# Patient Record
Sex: Female | Born: 1945
Health system: Southern US, Community
[De-identification: ages and names within clinical notes are randomized; demographics above are authoritative.]

## PROBLEM LIST (undated history)

## (undated) DIAGNOSIS — K469 Unspecified abdominal hernia without obstruction or gangrene: Secondary | ICD-10-CM

## (undated) DIAGNOSIS — K222 Esophageal obstruction: Secondary | ICD-10-CM

## (undated) DIAGNOSIS — M3500 Sicca syndrome, unspecified: Secondary | ICD-10-CM

## (undated) DIAGNOSIS — M810 Age-related osteoporosis without current pathological fracture: Secondary | ICD-10-CM

## (undated) HISTORY — PX: INTRAUTERINE DEVICE INSERTION: SHX323

## (undated) HISTORY — DX: Sjogren syndrome, unspecified: M35.00

## (undated) HISTORY — PX: HERNIA REPAIR: SHX51

## (undated) HISTORY — DX: Unspecified abdominal hernia without obstruction or gangrene: K46.9

## (undated) HISTORY — DX: Age-related osteoporosis without current pathological fracture: M81.0

## (undated) HISTORY — DX: Esophageal obstruction: K22.2

---

## 1999-06-01 ENCOUNTER — Other Ambulatory Visit: Admission: RE | Admit: 1999-06-01 | Discharge: 1999-06-01 | Payer: Self-pay | Admitting: Gynecology

## 2000-07-22 ENCOUNTER — Other Ambulatory Visit: Admission: RE | Admit: 2000-07-22 | Discharge: 2000-07-22 | Payer: Self-pay | Admitting: Gynecology

## 2000-07-26 ENCOUNTER — Encounter: Payer: Self-pay | Admitting: Gynecology

## 2000-07-26 ENCOUNTER — Encounter: Admission: RE | Admit: 2000-07-26 | Discharge: 2000-07-26 | Payer: Self-pay | Admitting: Gynecology

## 2001-12-30 ENCOUNTER — Other Ambulatory Visit: Admission: RE | Admit: 2001-12-30 | Discharge: 2001-12-30 | Payer: Self-pay | Admitting: Gynecology

## 2003-01-11 ENCOUNTER — Other Ambulatory Visit: Admission: RE | Admit: 2003-01-11 | Discharge: 2003-01-11 | Payer: Self-pay | Admitting: Gynecology

## 2004-03-06 ENCOUNTER — Other Ambulatory Visit: Admission: RE | Admit: 2004-03-06 | Discharge: 2004-03-06 | Payer: Self-pay | Admitting: Gynecology

## 2004-11-10 ENCOUNTER — Ambulatory Visit (HOSPITAL_COMMUNITY): Admission: RE | Admit: 2004-11-10 | Discharge: 2004-11-10 | Payer: Self-pay | Admitting: Gynecology

## 2005-10-22 ENCOUNTER — Encounter: Payer: Self-pay | Admitting: Family Medicine

## 2005-10-22 LAB — CONVERTED CEMR LAB

## 2006-07-11 ENCOUNTER — Emergency Department (HOSPITAL_COMMUNITY): Admission: EM | Admit: 2006-07-11 | Discharge: 2006-07-11 | Payer: Self-pay | Admitting: Emergency Medicine

## 2006-07-11 ENCOUNTER — Encounter: Admission: RE | Admit: 2006-07-11 | Discharge: 2006-07-11 | Payer: Self-pay

## 2006-08-08 ENCOUNTER — Encounter: Admission: RE | Admit: 2006-08-08 | Discharge: 2006-08-08 | Payer: Self-pay | Admitting: Gastroenterology

## 2006-08-15 ENCOUNTER — Other Ambulatory Visit: Admission: RE | Admit: 2006-08-15 | Discharge: 2006-08-15 | Payer: Self-pay | Admitting: Gynecology

## 2006-08-23 ENCOUNTER — Ambulatory Visit (HOSPITAL_COMMUNITY): Admission: RE | Admit: 2006-08-23 | Discharge: 2006-08-23 | Payer: Self-pay | Admitting: Gastroenterology

## 2006-09-26 ENCOUNTER — Ambulatory Visit: Payer: Self-pay | Admitting: Family Medicine

## 2007-03-14 ENCOUNTER — Ambulatory Visit: Payer: Self-pay | Admitting: Family Medicine

## 2007-05-29 ENCOUNTER — Encounter: Payer: Self-pay | Admitting: Family Medicine

## 2007-06-12 ENCOUNTER — Ambulatory Visit: Payer: Self-pay | Admitting: Family Medicine

## 2007-06-12 LAB — CONVERTED CEMR LAB
ALT: 15 units/L (ref 0–35)
AST: 20 units/L (ref 0–37)
Alkaline Phosphatase: 83 units/L (ref 39–117)
BUN: 9 mg/dL (ref 6–23)
Basophils Relative: 0.1 % (ref 0.0–1.0)
Bilirubin Urine: NEGATIVE
Bilirubin, Direct: 0.1 mg/dL (ref 0.0–0.3)
Blood in Urine, dipstick: NEGATIVE
CO2: 30 meq/L (ref 19–32)
Calcium: 9.5 mg/dL (ref 8.4–10.5)
Chloride: 105 meq/L (ref 96–112)
Eosinophils Relative: 2.2 % (ref 0.0–5.0)
Glucose, Bld: 98 mg/dL (ref 70–99)
Glucose, Urine, Semiquant: NEGATIVE
Ketones, urine, test strip: NEGATIVE
LDL Cholesterol: 97 mg/dL (ref 0–99)
Lymphocytes Relative: 26.5 % (ref 12.0–46.0)
Monocytes Relative: 9.7 % (ref 3.0–11.0)
Nitrite: NEGATIVE
Platelets: 309 10*3/uL (ref 150–400)
Protein, U semiquant: NEGATIVE
RBC: 4.34 M/uL (ref 3.87–5.11)
RDW: 11.8 % (ref 11.5–14.6)
Specific Gravity, Urine: 1.02
Total CHOL/HDL Ratio: 3.8
Total Protein: 8.1 g/dL (ref 6.0–8.3)
Triglycerides: 114 mg/dL (ref 0–149)
Urobilinogen, UA: 0.2
VLDL: 23 mg/dL (ref 0–40)
WBC: 3.7 10*3/uL — ABNORMAL LOW (ref 4.5–10.5)
pH: 6.5

## 2007-08-22 ENCOUNTER — Ambulatory Visit: Payer: Self-pay | Admitting: Family Medicine

## 2007-08-22 DIAGNOSIS — M3500 Sicca syndrome, unspecified: Secondary | ICD-10-CM | POA: Insufficient documentation

## 2007-08-22 DIAGNOSIS — K222 Esophageal obstruction: Secondary | ICD-10-CM | POA: Insufficient documentation

## 2008-01-06 ENCOUNTER — Telehealth: Payer: Self-pay | Admitting: Family Medicine

## 2008-01-13 ENCOUNTER — Ambulatory Visit: Payer: Self-pay | Admitting: Family Medicine

## 2008-03-16 ENCOUNTER — Telehealth: Payer: Self-pay | Admitting: Family Medicine

## 2008-10-18 ENCOUNTER — Ambulatory Visit: Payer: Self-pay | Admitting: Family Medicine

## 2008-10-18 DIAGNOSIS — J0110 Acute frontal sinusitis, unspecified: Secondary | ICD-10-CM | POA: Insufficient documentation

## 2008-10-18 DIAGNOSIS — J069 Acute upper respiratory infection, unspecified: Secondary | ICD-10-CM | POA: Insufficient documentation

## 2008-11-01 ENCOUNTER — Ambulatory Visit: Payer: Self-pay | Admitting: Internal Medicine

## 2009-06-17 ENCOUNTER — Ambulatory Visit (HOSPITAL_COMMUNITY): Admission: RE | Admit: 2009-06-17 | Discharge: 2009-06-17 | Payer: Self-pay | Admitting: Family Medicine

## 2009-06-17 LAB — HM MAMMOGRAPHY

## 2009-12-29 ENCOUNTER — Ambulatory Visit: Payer: Self-pay | Admitting: Family Medicine

## 2009-12-29 LAB — CONVERTED CEMR LAB
ALT: 17 units/L (ref 0–35)
AST: 22 units/L (ref 0–37)
Alkaline Phosphatase: 81 units/L (ref 39–117)
BUN: 15 mg/dL (ref 6–23)
Basophils Relative: 1.1 % (ref 0.0–3.0)
Bilirubin Urine: NEGATIVE
Bilirubin, Direct: 0.1 mg/dL (ref 0.0–0.3)
Chloride: 109 meq/L (ref 96–112)
Cholesterol: 160 mg/dL (ref 0–200)
Creatinine, Ser: 0.8 mg/dL (ref 0.4–1.2)
Eosinophils Relative: 1.7 % (ref 0.0–5.0)
GFR calc non Af Amer: 76.86 mL/min (ref >60)
Ketones, urine, test strip: NEGATIVE
LDL Cholesterol: 90 mg/dL (ref 0–99)
Lymphocytes Relative: 34.9 % (ref 12.0–46.0)
Monocytes Relative: 8.9 % (ref 3.0–12.0)
Neutrophils Relative %: 53.4 % (ref 43.0–77.0)
Nitrite: NEGATIVE
Platelets: 251 10*3/uL (ref 150.0–400.0)
RBC: 4.44 M/uL (ref 3.87–5.11)
Total Bilirubin: 0.7 mg/dL (ref 0.3–1.2)
Total CHOL/HDL Ratio: 3
Total Protein: 8.4 g/dL — ABNORMAL HIGH (ref 6.0–8.3)
Triglycerides: 97 mg/dL (ref 0.0–149.0)
Urobilinogen, UA: 0.2
VLDL: 19.4 mg/dL (ref 0.0–40.0)
WBC: 2.6 10*3/uL — ABNORMAL LOW (ref 4.5–10.5)

## 2010-01-02 ENCOUNTER — Ambulatory Visit: Payer: Self-pay | Admitting: Family Medicine

## 2010-01-02 DIAGNOSIS — D72819 Decreased white blood cell count, unspecified: Secondary | ICD-10-CM | POA: Insufficient documentation

## 2010-01-06 ENCOUNTER — Encounter (INDEPENDENT_AMBULATORY_CARE_PROVIDER_SITE_OTHER): Payer: Self-pay | Admitting: *Deleted

## 2010-02-20 ENCOUNTER — Encounter (INDEPENDENT_AMBULATORY_CARE_PROVIDER_SITE_OTHER): Payer: Self-pay | Admitting: *Deleted

## 2010-02-20 ENCOUNTER — Ambulatory Visit: Payer: Self-pay | Admitting: Family Medicine

## 2010-02-20 LAB — CONVERTED CEMR LAB
Eosinophils Absolute: 0 10*3/uL (ref 0.0–0.7)
Eosinophils Relative: 1 % (ref 0.0–5.0)
HCT: 41.9 % (ref 36.0–46.0)
Lymphs Abs: 1.4 10*3/uL (ref 0.7–4.0)
MCHC: 34.4 g/dL (ref 30.0–36.0)
MCV: 94 fL (ref 78.0–100.0)
Monocytes Absolute: 0.3 10*3/uL (ref 0.1–1.0)
Platelets: 280 10*3/uL (ref 150.0–400.0)
WBC: 3.8 10*3/uL — ABNORMAL LOW (ref 4.5–10.5)

## 2010-02-22 ENCOUNTER — Ambulatory Visit: Payer: Self-pay | Admitting: Internal Medicine

## 2010-03-16 ENCOUNTER — Telehealth: Payer: Self-pay | Admitting: Family Medicine

## 2010-03-22 ENCOUNTER — Telehealth (INDEPENDENT_AMBULATORY_CARE_PROVIDER_SITE_OTHER): Payer: Self-pay | Admitting: *Deleted

## 2010-03-23 ENCOUNTER — Ambulatory Visit: Payer: Self-pay | Admitting: Internal Medicine

## 2010-09-25 ENCOUNTER — Telehealth: Payer: Self-pay | Admitting: Family Medicine

## 2010-11-13 ENCOUNTER — Ambulatory Visit
Admission: RE | Admit: 2010-11-13 | Discharge: 2010-11-13 | Payer: Self-pay | Source: Home / Self Care | Attending: Family Medicine | Admitting: Family Medicine

## 2010-11-20 ENCOUNTER — Telehealth: Payer: Self-pay | Admitting: Family Medicine

## 2010-11-21 NOTE — Progress Notes (Signed)
Summary: pink eye  Phone Note Call from Patient   Caller: Patient Call For: Roderick Pee MD Reason for Call: Acute Illness Summary of Call: Has pink eye and is requesting treatment with no visit if possible. [facility] ) [phone] Initial call taken by: [facility] CMA AAMA,  September 25, 2010 9:23 AM  Follow-up for Phone Call        gentamicin ophthalmic drops, dispensed 10 cc directions one drop b.i.d., refills x 1 Follow-up by: Roderick Pee MD,  September 25, 2010 10:03 AM    New/Updated Medications: GENTAMICIN SULFATE 10 MG/ML SOLN (GENTAMICIN SULFATE) one drop in affected eye two times a day Prescriptions: GENTAMICIN SULFATE 10 MG/ML SOLN (GENTAMICIN SULFATE) one drop in affected eye two times a day  #10 cc x 0   Entered by:   Lynann Beaver CMA AAMA   Authorized by:   Roderick Pee MD   Signed by:   Lynann Beaver CMA AAMA on 09/25/2010   Method used:   Electron[facility]       [address]       [place], [place]  [address]       Ph: [phone] or [phone]       Fax: [phone]   RxID:   [id number] GENTAMICIN SULFATE 10 MG/ML SOLN (GENTAMICIN SULFATE) one drop in affected eye two times a day  #10 cc x 0   Entered by:   Lynann Beaver CMA AAMA   Authorized by:   Roderick Pee MD   Signed by:   Lynann Beaver CMA AAMA on 09/25/2010   Method used:   Electronically to        [facility] ([facility]ail[facility]       [address]       [place], [place]  [address]       Ph: [phone] or [phone]       Fax: [phone]   RxID:   [id number]  Notified pt.

## 2010-11-21 NOTE — Letter (Signed)
Summary: Previsit letter  [facility]  [address] [place], [place] [address]   Phone: [phone]  Fax: [phone]       01/06/2010 MRN: [id number]  Erika Jensen [address] [place], [place]  [address]  Dear [name],  Welcome to the Gastroenterology Division at [facility].    You are scheduled to see a nurse for your pre-procedure visit on 02/22/2010 at 9:00AM on the 3rd floor at [facility], [address]  We ask that you try to arrive at our office 15 minutes prior to your appointment time to allow for check-in.  Your nurse visit will consist of discussing your medical and surgical history, your immediate family medical history, and your medications.    Please bring a complete list of all your medications or, if you prefer, bring the medication bottles and we will list them.  We will need to be aware of both prescribed and over the counter drugs.  We will need to know exact dosage information as well.  If you are on blood thinners (Coumadin, Plavix, Aggrenox, Ticlid, etc.) please call our office today/prior to your appointment, as we need to consult with your physician about holding your medication.   Please be prepared to read and sign documents such as consent forms, a financial agreement, and acknowledgement forms.  If necessary, and with your consent, a friend or relative is welcome to sit-in on the nurse visit with you.  Please bring your insurance card so that we may make a copy of it.  If your insurance requires a referral to see a specialist, please bring your referral form from your primary care physician.  No co-pay is required for this nurse visit.     If you cannot keep your appointment, please call [phone] to cancel or reschedule prior to your appointment date.  This allows [place] the opportunity to schedule an appointment for another patient in need of care.    Thank you for choosing [facility] for your medical needs.  We  appreciate the opportunity to care for you.  Please visit [place] at our website  to learn more about our practice.                     Sincerely.                                                                                                                   The Gastroenterology Division

## 2010-11-21 NOTE — Miscellaneous (Signed)
Summary: [facility]  Clinical Lists Changes  Medications: Added new medication of MOVIPREP 100 GM  SOLR (PEG-KCL-NACL-NASULF-NA ASC-C) As per prep instructions. - Signed Rx of MOVIPREP 100 GM  SOLR (PEG-KCL-NACL-NASULF-NA ASC-C) As per prep instructions.;  #1 x 0;  Signed;  Entered by: Ezra Sites RN;  Authorized by: Iva Boop MD, [facility];  Method used: Electronically to [id number]*, [address] [place], [place], [place]  [address], Ph: [phone], Fax: [phone] Observations: Added new observation of NKA: T (02/22/2010 8:56)    Prescriptions: MOVIPREP 100 GM  SOLR (PEG-KCL-NACL-NASULF-NA ASC-C) As per prep instructions.  #1 x 0   Entered by:   Ezra Sites RN   Authorized by:   Iva Boop MD, [facility]   Signed by:   Ezra Sites RN on 02/22/2010   Method used:   Electronically to        [facility] [phone]* (retail)       [address]       [place], [place]  [address]       Ph: [phone]       Fax: [phone]   RxID:   [id number]

## 2010-11-21 NOTE — Letter (Signed)
Summary: [facility] Instructions  [facility]  [address] [place], [place] [address]   Phone: [phone]  Fax: [phone]       Laura-Lee Demers    1946-01-21    MRN: [id number]        Procedure Day /Date:  Thursday 03/16/2010     Arrival Time: 12:30 pm      Procedure Time: 1:30 pm     Location of Procedure:                    _x _   Endoscopy Center (4th Floor)                        PREPARATION FOR COLONOSCOPY WITH MOVIPREP   Starting 5 days prior to your procedure Saturday 5/21 do not eat nuts, seeds, popcorn, corn, beans, peas,  salads, or any raw vegetables.  Do not take any fiber supplements (e.g. Metamucil, Citrucel, and Benefiber).  THE DAY BEFORE YOUR PROCEDURE         DATE: Wednesday 5/25  1.  Drink clear liquids the entire day-NO SOLID FOOD  2.  Do not drink anything colored red or purple.  Avoid juices with pulp.  No orange juice.  3.  Drink at least 64 oz. (8 glasses) of fluid/clear liquids during the day to prevent dehydration and help the prep work efficiently.  CLEAR LIQUIDS INCLUDE: Water Jello Ice Popsicles Tea (sugar ok, no milk/cream) Powdered fruit flavored drinks Coffee (sugar ok, no milk/cream) Gatorade Juice: apple, white grape, white cranberry  Lemonade Clear bullion, consomm, broth Carbonated beverages (any kind) Strained chicken noodle soup Hard Candy                             4.  In the morning, mix first dose of MoviPrep solution:    Empty 1 Pouch A and 1 Pouch B into the disposable container    Add lukewarm drinking water to the top line of the container. Mix to dissolve    Refrigerate (mixed solution should be used within 24 hrs)  5.  Begin drinking the prep at 5:00 p.m. The MoviPrep container is divided by 4 marks.   Every 15 minutes drink the solution down to the next mark (approximately 8 oz) until the full liter is complete.   6.  Follow completed prep with 16 oz of clear liquid of your choice  (Nothing red or purple).  Continue to drink clear liquids until bedtime.  7.  Before going to bed, mix second dose of MoviPrep solution:    Empty 1 Pouch A and 1 Pouch B into the disposable container    Add lukewarm drinking water to the top line of the container. Mix to dissolve    Refrigerate  THE DAY OF YOUR PROCEDURE      DATE: Thursday 5/26  Beginning at 8:30 a.m. (5 hours before procedure):         1. Every 15 minutes, drink the solution down to the next mark (approx 8 oz) until the full liter is complete.  2. Follow completed prep with 16 oz. of clear liquid of your choice.    3. You may drink clear liquids until 11:30 am (2 HOURS BEFORE PROCEDURE).   MEDICATION INSTRUCTIONS  Unless otherwise instructed, you should take regular prescription medications with a small sip of water   as early as possible the morning of  your procedure.         OTHER INSTRUCTIONS  You will need a responsible adult at least 65 years of age to accompany you and drive you home.   This person must remain in the waiting room during your procedure.  Wear loose fitting clothing that is easily removed.  Leave jewelry and other valuables at home.  However, you may wish to bring a book to read or  an iPod/MP3 player to listen to music as you wait for your procedure to start.  Remove all body piercing jewelry and leave at home.  Total time from sign-in until discharge is approximately 2-3 hours.  You should go home directly after your procedure and rest.  You can resume normal activities the  day after your procedure.  The day of your procedure you should not:   Drive   Make legal decisions   Operate machinery   Drink alcohol   Return to work  You will receive specific instructions about eating, activities and medications before you leave.    The above instructions have been reviewed and explained to me by   Ezra Sites RN  Feb 22, 2010 9:24 AM     I fully understand and can  verbalize these instructions _____________________________ Date _________

## 2010-11-21 NOTE — Procedures (Signed)
Summary: Colonoscopy  Patient: Erika Jensen Note: All result statuses are Final unless otherwise noted.  Tests: (1) Colonoscopy (COL)   COL Colonoscopy           DONE     [facility]     [address]     [place], [place]  [address]           COLONOSCOPY PROCEDURE REPORT           PATIENT:  [name], [name]  MR#:  [id number]     BIRTHDATE:  [birth year], 63 yrs. old  GENDER:  female     ENDOSCOPIST:  Iva Boop, MD, [facility]     REF. BY:  Tinnie Gens A. Tawanna Cooler, M.D.     PROCEDURE DATE:  03/23/2010     PROCEDURE:  Colonoscopy [address]     ASA CLASS:  Class I     INDICATIONS:  Routine Risk Screening     MEDICATIONS:   Fentanyl 50 mcg IV, Versed 6 mg IV           DESCRIPTION OF PROCEDURE:   After the risks benefits and     alternatives of the procedure were thoroughly explained, informed     consent was obtained.  Digital rectal exam was performed and     revealed no abnormalities.   The LB [id number] endoscope     was introduced through the anus and advanced to the cecum, which     was identified by both the appendix and ileocecal valve, without     limitations.  The quality of the prep was excellent, using     MoviPrep.  The instrument was then slowly withdrawn as the colon     was fully examined. Insertion: 2:49 minutes  Withdrawal: 7:50     minutes     <<PROCEDUREIMAGES>>           FINDINGS:  Severe diverticulosis was found in the sigmoid colon.     This was otherwise a normal examination of the colon. including     right colon retroflexion.   Retroflexed views in the rectum     revealed internal hemorrhoids.    The scope was then withdrawn     from the patient and the procedure completed.           COMPLICATIONS:  None           ENDOSCOPIC IMPRESSION:     1) Severe diverticulosis in the sigmoid colon     2) Internal hemorrhoids     3) Otherwise normal examination, excellent prep           REPEAT EXAM:  In 10 year(s) for routine screening colonocsopy.          Iva Boop, MD, [facility]           CC:  Roderick Pee, MD     The Patient           n.     [name] Doctor:   Iva Boop at 03/23/2010 10:27 AM           Veda Canning, [phone]  Note: An exclamation mark (!) indicates a result that was not dispersed into the flowsheet. Document Creation Date: 03/23/2010 10:28 AM _______________________________________________________________________  (1) Order result status: Final Collection or observation date-time: 03/23/2010 10:17 Requested date-time:  Receipt date-time:  Reported date-time:  Referring Physician:   Ordering Physician: Stan Head [phone]) Specimen Source:  Source: Launa Grill Order Number: [id number] Lab site:  Appended Document: Colonoscopy    Clinical Lists Changes  Observations: Added new observation of COLONNXTDUE: 03/2020 (03/23/2010 12:51)

## 2010-11-21 NOTE — Assessment & Plan Note (Signed)
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  She is on Aciphex 20 mg daily because of reflux and an upper esophageal stricture.  She is able to swallow with no pain or difficulty.  She gets routine eye care.  Dental care, does not do BSE monthly, but does get annual mammography.  She canceled her colonoscopy..........Marland Kitchen encouraged her to have a colonoscopy.  Done by GYN, tetanus, 2008.  Allergies: No Known Drug Allergies  Past History:  Past medical, surgical, family and social histories (including risk factors) reviewed, and no changes noted (except as noted below).  Past Medical History: Reviewed history from 08/22/2007 and no changes required. Sojrens syndrome esophageal stricture, dilatationOctober 07 bilateral hernia repair childbirth x 2  Past Surgical History: Reviewed history from 05/29/2007 and no changes required. CB X2 bicat hernias IUD perforation  Family History: Reviewed history from 08/22/2007 and no changes required. Family History of Alcoholism/Addiction Family History of Anxiety Family History Hypertension her father had a history of seizure disorder, and renal cell carcinoma.  Her mother died at 24 of dementia with underlying hypertension.  Social History: Reviewed history from 08/22/2007 and no changes required. Occupation: IT trainer Married Never Smoked Alcohol use-no Drug use-no Regular exercise-yes one daughter,  who lives in [place] with two children, a 33-year-old and a 66-year-old granddaughter, younger daughter lives here in [place], who works with her husband Raiford Noble in the furniture business  Review of Systems      See HPI  Physical Exam  General:  Well-developed,well-nourished,in no acute distress; alert,appropriate and cooperative throughout examination Head:  Normocephalic and atraumatic without obvious abnormalities. No apparent alopecia or balding. Eyes:  No corneal or conjunctival inflammation noted. EOMI. Perrla. Funduscopic exam benign, without hemorrhages, exudates or papilledema. Vision grossly normal. Ears:  External ear exam shows no significant lesions or deformities.  Otoscopic examination reveals clear canals, tympanic membranes are intact bilaterally without bulging, retraction, inflammation or discharge. Hearing is grossly normal bilaterally. Nose:  External nasal examination shows no deformity or inflammation. Nasal mucosa are pink and moist without lesions or exudates. Mouth:  Oral mucosa and oropharynx without lesions or exudates.  Teeth in good repair. Neck:  No deformities, masses, or tenderness noted. Chest Wall:  No deformities, masses, or tenderness noted. Breasts:  No mass, nodules, thickening, tenderness, bulging, retraction, inflamation, nipple discharge or skin changes noted.   Lungs:  Normal respiratory effort, chest expands symmetrically. Lungs are clear to auscultation, no crackles or wheezes. Heart:  Normal rate and regular rhythm. S1 and S2 normal without gallop, murmur, click, rub or other extra sounds. Abdomen:  Bowel sounds positive,abdomen soft and non-tender without masses, organomegaly or hernias noted. Msk:  No deformity or scoliosis noted of thoracic or lumbar spine.   Pulses:  R and L carotid,radial,femoral,dorsalis pedis and posterior tibial pulses are full and equal bilaterally Extremities:  No clubbing, cyanosis, edema, or deformity noted with normal full  range of motion of all joints.   Neurologic:  No cranial nerve deficits noted. Station and gait are normal. Plantar reflexes are down-going bilaterally. DTRs are symmetrical throughout. Sensory, motor and coordinative functions appear intact. Skin:  Intact without suspicious lesions or rashes Cervical Nodes:  No lymphadenopathy noted Axillary Nodes:  No palpable lymphadenopathy Inguinal Nodes:  No significant adenopathy Psych:  Cognition and judgment appear intact. Alert and cooperative with normal attention span and concentration. No apparent delusions, illusions, hallucinations   Impression & Recommendations:  Problem # 1:  SJOGREN'S SYNDROME (ICD-710.2) Assessment Unchanged  Orders: Prescription Created Electronically [phone])  Problem # 2:  ESOPHAGEAL STRICTURE (ICD-530.3) Assessment: Improved  Orders: Prescription Created Electronically [phone])  Problem # 3:  HEALTH SCREENING (ICD-V70.0) Assessment: Unchanged  Orders: Prescription Created Electronically [phone]) EKG w/ Interpretation ([address])  Complete Medication List: 1)  Aciphex 20 Mg Tbec (Rabeprazole sodium) .... Take 1 tablet by mouth once a day  Other Orders: Gastroenterology Referral (GI)  Patient Instructions: 1)  Please schedule a follow-up appointment in 1 year. 2)  It is important that you exercise regularly at least 20 minutes 5 times a week. If you develop chest pain, have severe difficulty breathing, or feel very tired , stop exercising immediately and seek medical attention. 3)  Schedule your mammogram/BSE monthly !!!!!!!!!!!!. 4)  Schedule a colonoscopy/sigmoidoscopy to help detect colon cancer. 5)  Take calcium +Vitamin D daily. 6)  Take an Aspirin every day. 7)  repeat your CBC in May     288.50 Prescriptions: ACIPHEX 20 MG  TBEC (RABEPRAZOLE SODIUM) Take 1 tablet by mouth once a day  #100 x 4   Entered and Authorized by:   Roderick Pee MD   Signed by:   Roderick Pee MD on 01/02/2010   Method  used:   Electronically to        [facility] [phone]* (retail)       [address]       [place], [place]  [address]       Ph: [phone]       Fax: [phone]   RxID:   [id number]

## 2010-11-21 NOTE — Progress Notes (Signed)
Summary: ? re prep instructions  Phone Note Call from Patient Call back at [phone]   Caller: Patient Call For: Leone Payor Reason for Call: Talk to Nurse Summary of Call: Patient has questions regarding prep instctions Initial call taken by: Tawni Levy,  March 22, 2010 9:16 AM  Follow-up for Phone Call        Returned pt's phone call- no answer.  Will try later. Follow-up by: Ezra Sites RN,  March 22, 2010 9:37 AM  Additional Follow-up for Phone Call Additional follow up Details #1::        Answered pt's questions concerning when to beginn prep tomorrow. Additional Follow-up by: Ezra Sites RN,  March 22, 2010 10:09 AM

## 2010-11-21 NOTE — Progress Notes (Signed)
Summary: sinus infection  Phone Note Call from Patient   Caller: Patient Call For: Roderick Pee MD Summary of Call: Pt is complaining of sinus infection....Marland KitchenURI x one week with headache and post nasal draining.  No fever.  Insists we ask Dr. Tawanna Cooler to send an antibiotic to [facility] [phone]) [phone] Initial call taken by: Lynann Beaver CMA,  Mar 16, 2010 9:29 AM  Follow-up for Phone Call        amoxicillin 500 mg, number 30 directions two in the morning, one at bedtime x bottle empty  for sinusitis, refills x 1 Follow-up by: Roderick Pee MD,  Mar 16, 2010 10:38 AM    New/Updated Medications: AMOXICILLIN 500 MG CAPS (AMOXICILLIN) take two tabs in the morning and one tab at bedtime by mouth until the bottle is empty Prescriptions: AMOXICILLIN 500 MG CAPS (AMOXICILLIN) take two tabs in the morning and one tab at bedtime by mouth until the bottle is empty  #30 x 1   Entered by:   Kern Reap CMA (AAMA)   Authorized by:   Roderick Pee MD   Signed by:   Kern Reap CMA (AAMA) on 03/16/2010   Method used:   Electronically to        [facility] [phone]* (retail)       [address]       [place], [place]  [address]       Ph: [phone]       Fax: [phone]   RxID:   [id number]

## 2010-11-23 NOTE — Assessment & Plan Note (Signed)
Summary: ? eye inf//ccm   Vital Signs:  Patient profile:   65 year old female Height:      65 inches Weight:      138 pounds BMI:     23.05 Temp:     97.6 degrees F oral BP sitting:   110 / 80  (left arm) Cuff size:   regular  Vitals Entered By: Kern Reap CMA Duncan Dull) (November 13, 2010 4:26 PM) CC: follow-up visit for eye infection   CC:  follow-up visit for eye infection.  History of Present Illness: Erika Jensen is a 65 year old, married female, nonsmoker, who comes in today for evaluation of conjunctivitis.  Early in the month.  She had an episode of conjunctivitis and use the gentamicin eyedrops and after 3 days.  The redness [address].  She was symptom free for two weeks.  A week ago the redness recurred.  Since that, time.  She's using the drops, but the redness will go away.  She is continued did wear contacts.  Daily.  No pain no blurred vision  Allergies: No Known Drug Allergies  Past History:  Past medical, surgical, family and social histories (including risk factors) reviewed for relevance to current acute and chronic problems.  Past Medical History: Reviewed history from 08/22/2007 and no changes required. Sojrens syndrome esophageal stricture, dilatationOctober 07 bilateral hernia repair childbirth x 2  Past Surgical History: Reviewed history from 05/29/2007 and no changes required. CB X2 bicat hernias IUD perforation  Family History: Reviewed history from 08/22/2007 and no changes required. Family History of Alcoholism/Addiction Family History of Anxiety Family History Hypertension her father had a history of seizure disorder, and renal cell carcinoma.  Her mother died at 64 of dementia with underlying hypertension.  Social History: Reviewed history from 08/22/2007 and no changes required. Occupation: IT trainer Married Never Smoked Alcohol use-no Drug use-no Regular exercise-yes one daughter, who lives in [place] with two children, a 73-year-old and a  31-year-old granddaughter, younger daughter lives here in [place], who works with her husband Raiford Noble in the furniture business  Review of Systems      See HPI  Physical Exam  General:  Well-developed,well-nourished,in no acute distress; alert,appropriate and cooperative throughout examination Head:  Normocephalic and atraumatic without obvious abnormalities. No apparent alopecia or balding. Eyes:  the eyes appear normal except for some erythema of the conjunctiva which does not extend over the cornea.  The pupils are equal and reactive to light and accommodation.  Anterior chamber normal   Problems:  Medical Problems Added: 1)  Dx of Conjunctivitis  (ICD-372.30)  Impression & Recommendations:  Problem # 1:  CONJUNCTIVITIS (ICD-372.30) Assessment New  Her updated medication list for this problem includes:    Ciprofloxacin Hcl 0.3 % Soln (Ciprofloxacin hcl) .Marland Kitchen... 1 gtt r & l eye three times a day until clear  Complete Medication List: 1)  Aciphex 20 Mg Tbec (Rabeprazole sodium) .... Take 1 tablet by mouth once a day 2)  Amoxicillin 500 Mg Caps (Amoxicillin) .... Take two tabs in the morning and one tab at bedtime by mouth until the bottle is empty 3)  Gentamicin Sulfate 10 Mg/ml Soln (Gentamicin sulfate) .... One drop in affected eye two times a day 4)  Ciprofloxacin Hcl 0.3 % Soln (Ciprofloxacin hcl) .Marland Kitchen.. 1 gtt r & l eye three times a day until clear  Patient Instructions: 1)  do not wear the contacts. 2)  Get new cleaning solution and cleaned in the container, and air , dry.  Wear  glasses for couple days. 3)  By Friday, your eyes  should be normal........ if not call me Prescriptions: CIPROFLOXACIN HCL 0.3 % SOLN (CIPROFLOXACIN HCL) 1 gtt R & L eye three times a day until clear  #10 cc x 2   Entered and Authorized by:   Roderick Pee MD   Signed by:   Roderick Pee MD on 11/13/2010   Method used:   Electronically to        [facility]. [phone]* (retail)        [address]       [place], [place]  [address]       Ph: [phone]       Fax: [phone]   RxID:   [id number]    Orders Added: 1)  Est. Patient Level III [38[address]]

## 2010-11-29 NOTE — Progress Notes (Signed)
  Phone Note Call from Patient   Caller: Patient Call For: Roderick Pee MD Summary of Call: Eye is worse and would like a referral to Opthamology. [phone] Initial call taken by: Lynann Beaver CMA AAMA,  November 20, 2010 9:55 AM  Follow-up for Phone Call        referred to Dr. Gweneth Dimitri Follow-up by: Roderick Pee MD,  November 20, 2010 10:15 AM

## 2011-02-20 ENCOUNTER — Other Ambulatory Visit: Payer: Self-pay | Admitting: Family Medicine

## 2011-03-09 NOTE — Op Note (Signed)
NAME:  TAITE, BALDASSARI              ACCOUNT NO.:  [id number]   MEDICAL RECORD NO.:  [id number]          PATIENT TYPE:  AMB   LOCATION:  ENDO                         FACILITY:  [facility]   PHYSICIAN:  James L. Malon Kindle., M.D.DATE OF BIRTH:  [birth year]   DATE OF PROCEDURE:  08/23/2006  DATE OF DISCHARGE:                                 OPERATIVE REPORT   SURGEON:  Fayrene Fearing L. Randa Evens, M.D.   PROCEDURE:  Esophagogastroduodenoscopy with Savary dilatation.   MEDICATIONS:  Fentanyl 50 mcg, Versed 5 mg IV, Cetacaine spray.   INDICATIONS:  A nice, 65 year old woman, who has had a reflux-induced  stricture, probably exacerbated by Sjogren syndrome.  She has had a food  impaction and had a barium swallow showing a stricture.   DESCRIPTION OF PROCEDURE:  The procedure had been explained to the patient  and consent obtained.  In the left lateral decubitus position, endoscopy was  done.  Using fluoroscopic guidance, the scope was inserted and advanced.  The stricture was seen in the distal esophagus and photographed.  The scope  easily passed the stricture and a complete endoscopy was performed.  The  stomach, including the antrum and body, were seen well and were normal.  On  the retroflex view, it was normal.  I passed the scope into the duodenum,  down into the second portion, and we placed a guide wire into the duodenal  bulb and withdrew the scope over the guide wire.  Quick fluoro showed the  wire traversing the diaphragm.  Then, we then, with the patient's neck  extended, passed the 39, and then a 42-French dilator.  There was heme with  the 42 [place].  At this point, the guide wire was withdrawn.  The patient  tolerated the procedure well.  There were no immediate complications.   ASSESSMENT:  Esophageal stricture, dilated to 46 [place].   PLAN:  Routine post-dilatation instructions.  Will repeat procedure as  needed.           ______________________________  Llana Aliment Malon Kindle.,  M.D.     Waldron Session  D:  08/23/2006  T:  08/24/2006  Job:  [id number]

## 2011-03-09 NOTE — Consult Note (Signed)
NAME:  Erika Jensen, Erika Jensen              ACCOUNT NO.:  [id number]   MEDICAL RECORD NO.:  [id number]          PATIENT TYPE:  EMS   LOCATION:  MAJO                         FACILITY:  [facility]   PHYSICIAN:  James L. Malon Kindle., M.D.DATE OF BIRTH:  [birth year]   DATE OF CONSULTATION:  DATE OF DISCHARGE:                                   CONSULTATION   DATE OF CONSULTATION:  July 11, 2006.   PRIMARY CARE PHYSICIAN:  Dr. Estill Bakes.   HISTORY:  65 year old woman with Sjogren syndrome who has had intermittent  dysphagia for a number of years.  No heartburn, is not on any medication for  heartburn, never has been.  She occasionally has things that stick and they  usually go down.  Yesterday she was eating chicken at lunch and has had an  obstructed esophagus ever since.  Dr. Phylliss Bob ordered a barium swallow that  showed a meat impaction in the distal esophagus.  She was sent to the  emergency room.   CURRENT MEDICATIONS:  None.   ALLERGIES:  No known drug allergies.   PAST MEDICAL HISTORY:  History of Sjogren syndrome, has trouble with making  saliva and does have dry eyes.  No other chronic medical problems.  Only  surgery has been a hernia repair and an IUD removal.   PHYSICAL EXAMINATION:  Vital signs are normal.  She is an alert white female  who is oriented.  NECK:  Is supple, throat is normal.  HEART:  Regular rate and rhythm without murmurs or gallops.  LUNGS:  Are clear.   ASSESSMENT:  Obstructed esophagus with chicken in the esophagus since  yesterday.  We will attempt immediate endoscopy with the removal of the  bolus.  I have informed the patient and her husband of the risk of  perforation, possibility for general anesthesia with a thoracic surgeon with  a redo scope if we fail to remove it, etc.           ______________________________  Llana Aliment. Malon Kindle., M.D.     Waldron Session  D:  07/11/2006  T:  07/11/2006  Job:  [id number]   cc:   Estill Bakes, MD

## 2011-03-09 NOTE — Op Note (Signed)
NAME:  Erika Jensen, JACOBOWITZ              ACCOUNT NO.:  [id number]   MEDICAL RECORD NO.:  [id number]          PATIENT TYPE:  EMS   LOCATION:  MAJO                         FACILITY:  [facility]   PHYSICIAN:  James L. Malon Kindle., M.D.DATE OF BIRTH:  12/21/1945   DATE OF PROCEDURE:  07/11/2006  DATE OF DISCHARGE:                                 OPERATIVE REPORT   SURGEON:  Fayrene Fearing L. Randa Evens, M.D.   PROCEDURE:  Esophagogastroduodenoscopy with removal of food impaction.   MEDICATIONS:  Cetacaine spray, fentanyl 60 mcg, Versed 4 mg IV.   INDICATIONS:  A nice 65 year old woman with Sjogren syndrome, who was eating  chicken yesterday and became obstructed.  A barium swallow ordered this  morning showed a completely obstructed esophagus.  She was sent over to the  emergency room and we were called by the emergency room doctor to see her.   DESCRIPTION OF PROCEDURE:  The procedure had been explained to the patient  and her husband, including the potential risks of perforation, failure to  remove the impaction, etc., and consent obtained.  In the left lateral  decubitus position, the Olympus scope was inserted and advanced.  The  esophagus was entered.  There was a large amount of barium and this was  sucked out.  We reached the distal esophagus and there was a large chicken  impaction in the distal esophagus.  We were unable to get the tripod  retriever around it completely, and it was quite soft and broke apart due to  the fact that it had been in there so long.  We broke it apart and pulled  some pieces out.  I tried the Lucina Mellow net, and that would not get around it.  We then reinserted the scope and passed the scope a total of 4 times,  breaking pieces apart, pulling them, and on the fourth time as we were  breaking the pieces apart, the remainder popped through into the stomach.  A  complete endoscopy was performed.  The scope was passed and the gastric  outlet was found and passed.  The duodenum was  normal, as were the pyloric  channel and antrum.  The body of the stomach was normal.  The fundus and  cardia were seen on the retroflex view and were normal.  In the distal  esophagus, right at the GE junction, it was markedly reddened and inflamed,  where the food had been impacted.  The esophagus held air well.  There were  no obvious tears or perforations.  There were only small pieces of meat that  were still in the esophagus.  These were pushed down with the tip of the  scope and pushed into the stomach.  The scope was withdrawn.  The patient  tolerated the procedure relatively well.  There were no immediate  complications.   ASSESSMENT:  Food impaction in the distal esophagus, removed.   PLAN:  We will start her on Aciphex 20 mg daily, and we will have her take  only clear liquids tonight, with advancement to soft foods tomorrow.  I will  see her  back in the office in 1 month, and we will decide whether she needs  dilating or not.           ______________________________  Llana Aliment. Malon Kindle., M.D.     Waldron Session  D:  07/11/2006  T:  07/11/2006  Job:  [id number]   cc:   Pilar Grammes

## 2011-07-03 ENCOUNTER — Encounter: Payer: Self-pay | Admitting: Family Medicine

## 2011-07-03 ENCOUNTER — Ambulatory Visit (INDEPENDENT_AMBULATORY_CARE_PROVIDER_SITE_OTHER): Payer: Medicare Other | Admitting: Family Medicine

## 2011-07-03 DIAGNOSIS — K219 Gastro-esophageal reflux disease without esophagitis: Secondary | ICD-10-CM | POA: Insufficient documentation

## 2011-07-03 DIAGNOSIS — K222 Esophageal obstruction: Secondary | ICD-10-CM

## 2011-07-03 DIAGNOSIS — R0789 Other chest pain: Secondary | ICD-10-CM | POA: Insufficient documentation

## 2011-07-03 NOTE — Progress Notes (Signed)
  Subjective:    Patient ID: Erika Jensen, female    DOB: 1946/09/17, 65 y.o.   MRN: [id number]  HPIPatricia is a 65 year old, married female, nonsmoker, who comes in today for evaluation of atypical chest pain.  She states in the past, year.  She's had 3 episodes of chest pain, which he describes as a pressure-like sensation, and she points to the mid upper chest as a source of the discomfort.  Her last episode started about 8 p.m. Last night.  She was sitting and it started gradually.  It was persistent.  She finally took some Tums and one Motrin went to bed.  She recalls that last week.  She had some dental work done and did not take her Aciphex on a regular basis.  The other two episodes were similar.  She's had a history of reflux esophagitis, and indeed, has had an esophageal stricture that had to be dilated.  No cardiac no pulmonary symptoms.  She enjoys hiking and does this without any chest pain.    Review of Systems    General cardiac, pulmonary GU systems otherwise negative Objective:   Physical Exam Well-developed well-nourished, female, in no acute distress.  Examination heart and lungs were normal.  EKG normal       Assessment & Plan:  Reflux esophagitis, double Aciphex for two weeks, then go back to one a day.  Return for annual physical

## 2011-09-04 ENCOUNTER — Telehealth: Payer: Self-pay | Admitting: Family Medicine

## 2011-09-04 ENCOUNTER — Other Ambulatory Visit: Payer: Self-pay | Admitting: Family Medicine

## 2011-09-04 DIAGNOSIS — IMO0002 Reserved for concepts with insufficient information to code with codable children: Secondary | ICD-10-CM

## 2011-09-04 MED ORDER — CEPHALEXIN 500 MG PO CAPS: ORAL_CAPSULE | ORAL | Status: DC

## 2011-09-04 NOTE — Telephone Encounter (Signed)
Please advise 

## 2011-09-04 NOTE — Telephone Encounter (Signed)
Pt is out of town and will be back tomorrow. Pt has an infected finger, around hangnail. Pt req work in [employer] or and abx called in to [employer] at [facility].

## 2011-09-04 NOTE — Telephone Encounter (Signed)
I talked with Dan Maker on her way back from [place] with an infected finger started on Keflex tonight come to the office first thing at 815 in the morning

## 2011-09-05 ENCOUNTER — Ambulatory Visit (INDEPENDENT_AMBULATORY_CARE_PROVIDER_SITE_OTHER): Payer: PRIVATE HEALTH INSURANCE | Admitting: Family Medicine

## 2011-09-05 ENCOUNTER — Telehealth: Payer: Self-pay | Admitting: *Deleted

## 2011-09-05 ENCOUNTER — Encounter: Payer: Self-pay | Admitting: Family Medicine

## 2011-09-05 DIAGNOSIS — L03019 Cellulitis of unspecified finger: Secondary | ICD-10-CM

## 2011-09-05 NOTE — Patient Instructions (Signed)
Warm soaks twice daily.  Continue the antibiotic to twice daily.  Return p.r.n.  If he sees streaks run up your finger.  Call the hand surgeon immediately

## 2011-09-05 NOTE — Telephone Encounter (Signed)
Pt is in [place] and has an infection around the nail, and needs an antibiotic, please.  Please call her, and she will give you the pharmacy number.

## 2011-09-05 NOTE — Progress Notes (Signed)
  Subjective:    Patient ID: LACHELE LIEVANOS, female    DOB: Sep 09, 1946, 65 y.o.   MRN: [id number]  HPIPatricia is a 65 year old, married female, nonsmoker, who comes in today for evaluation of an infection ring finger, right hand.  She states she was in [place] on Monday at a business meeting at her nails done in the next day.  He noticed some erythema at the base of the nail.  She flew back last night.  We started her on Keflex last night.  1 g b.i.d. And warm soaks, and she comes in today for evaluation.  No fever, chills, no streaks up her finger    Review of Systems    General and dermatologic review of systems otherwise negative Objective:   Physical Exam Well-developed well-nourished, female, in no acute distress.  Examination hand shows erythema around the base of the nail, right ring finger.  No process.  No streaks up her finger.  Full range of motion       Assessment & Plan:  Paronychia right ring finger.  Plan warm soaks continue Keflex 1 g b.i.d. Advised to see and call hand surgeon.  Over the weekend if she develops streaks up her finger.

## 2011-09-06 ENCOUNTER — Encounter: Payer: Medicare Other | Admitting: Family Medicine

## 2011-09-06 NOTE — Telephone Encounter (Signed)
Patient was seen in office 09/05/11

## 2011-09-20 ENCOUNTER — Other Ambulatory Visit: Payer: Self-pay | Admitting: Family Medicine

## 2011-09-20 ENCOUNTER — Ambulatory Visit: Payer: PRIVATE HEALTH INSURANCE | Admitting: Internal Medicine

## 2011-09-20 ENCOUNTER — Other Ambulatory Visit: Payer: Self-pay | Admitting: *Deleted

## 2011-09-20 DIAGNOSIS — L03019 Cellulitis of unspecified finger: Secondary | ICD-10-CM

## 2011-09-20 MED ORDER — MOXIFLOXACIN HCL 400 MG PO TABS: 400.0000 mg | ORAL_TABLET | Freq: Every day | ORAL | Status: DC

## 2011-10-25 ENCOUNTER — Telehealth: Payer: Self-pay | Admitting: Family Medicine

## 2011-10-25 NOTE — Telephone Encounter (Signed)
Pt was on aciphex 20 mg per INS pt must change to pantoprazole call into [facility] [address]

## 2011-10-26 MED ORDER — PANTOPRAZOLE SODIUM 20 MG PO TBEC: 20.0000 mg | DELAYED_RELEASE_TABLET | Freq: Every day | ORAL | Status: DC

## 2011-10-26 NOTE — Telephone Encounter (Signed)
rx sent

## 2011-10-30 ENCOUNTER — Encounter: Payer: Medicare Other | Admitting: Family Medicine

## 2011-11-22 ENCOUNTER — Encounter: Payer: Medicare Other | Admitting: Family Medicine

## 2011-11-22 ENCOUNTER — Other Ambulatory Visit: Payer: Self-pay | Admitting: *Deleted

## 2011-11-22 MED ORDER — HYDROCODONE-HOMATROPINE 5-1.5 MG/5ML PO SYRP: 5.0000 mL | ORAL_SOLUTION | Freq: Three times a day (TID) | ORAL | Status: AC | PRN

## 2011-11-27 ENCOUNTER — Encounter: Payer: Self-pay | Admitting: Family Medicine

## 2011-11-27 ENCOUNTER — Ambulatory Visit (INDEPENDENT_AMBULATORY_CARE_PROVIDER_SITE_OTHER): Payer: Medicare Other | Admitting: Family Medicine

## 2011-11-27 VITALS — BP 120/84 | Temp 98.0°F | Ht 65.25 in | Wt 132.0 lb

## 2011-11-27 DIAGNOSIS — M3500 Sicca syndrome, unspecified: Secondary | ICD-10-CM

## 2011-11-27 DIAGNOSIS — K222 Esophageal obstruction: Secondary | ICD-10-CM

## 2011-11-27 DIAGNOSIS — Z23 Encounter for immunization: Secondary | ICD-10-CM | POA: Diagnosis not present

## 2011-11-27 DIAGNOSIS — E785 Hyperlipidemia, unspecified: Secondary | ICD-10-CM

## 2011-11-27 DIAGNOSIS — D72819 Decreased white blood cell count, unspecified: Secondary | ICD-10-CM

## 2011-11-27 DIAGNOSIS — K219 Gastro-esophageal reflux disease without esophagitis: Secondary | ICD-10-CM | POA: Diagnosis not present

## 2011-11-27 DIAGNOSIS — Z0000 Encounter for general adult medical examination without abnormal findings: Secondary | ICD-10-CM

## 2011-11-27 LAB — POCT URINALYSIS DIPSTICK
Bilirubin, UA: NEGATIVE
Glucose, UA: NEGATIVE
Leukocytes, UA: NEGATIVE
Nitrite, UA: NEGATIVE

## 2011-11-27 MED ORDER — ESTROGENS, CONJUGATED 0.625 MG/GM VA CREA: TOPICAL_CREAM | Freq: Every day | VAGINAL | Status: DC

## 2011-11-27 MED ORDER — PANTOPRAZOLE SODIUM 20 MG PO TBEC: 20.0000 mg | DELAYED_RELEASE_TABLET | Freq: Every day | ORAL | Status: DC

## 2011-11-27 NOTE — Progress Notes (Signed)
  Subjective:    Patient ID: Erika Jensen, female    DOB: 04/15/1946, 66 y.o.   MRN: [id number]  HPI  Erika Jensen   is a 66 year old married female nonsmoker who comes in today for general physical examination because of a history of Sjogren's syndrome    She's always been in excellent health she's had no chronic health problems except for the Sjogren's syndrome. She sees her ophthalmologist on a regular basis because she has dry eyes. For years ago she had a piece of food stuck in her esophagus that required emergent endoscopy. She subsequently had a stricture dilated and has been on Protonix 20 mg daily since that time.. She has no complaints of dysphagia.  She gets routine eye care, dental care, BSE monthly, last mammogram August 2010. Encouraged again you mammography. Pelvic examination 2 years ago normal no history of any GYN problems.  She is 15 years postmenopausal and has no history of any GYN problems.  Her cognitive function is normal she continues to work full-time. She does not exercise on a regular basis because she works full-time as an Airline pilot.  Review of Systems  Constitutional: Negative.   HENT: Negative.   Eyes: Negative.   Respiratory: Negative.   Cardiovascular: Negative.   Gastrointestinal: Negative.   Genitourinary: Negative.   Musculoskeletal: Negative.   Neurological: Negative.   Hematological: Negative.   Psychiatric/Behavioral: Negative.        Objective:   Physical Exam  Constitutional: She appears well-developed and well-nourished.  HENT:  Head: Normocephalic and atraumatic.  Right Ear: External ear normal.  Left Ear: External ear normal.  Nose: Nose normal.  Mouth/Throat: Oropharynx is clear and moist.  Eyes: EOM are normal. Pupils are equal, round, and reactive to light.  Neck: Normal range of motion. Neck supple. No thyromegaly present.  Cardiovascular: Normal rate, regular rhythm, normal heart sounds and intact distal pulses.  Exam reveals no  gallop and no friction rub.   No murmur heard. Pulmonary/Chest: Effort normal and breath sounds normal.  Abdominal: Soft. Bowel sounds are normal. She exhibits no distension and no mass. There is no tenderness. There is no rebound.  Genitourinary:       Bilateral breast exam normal  Musculoskeletal: Normal range of motion.  Lymphadenopathy:    She has no cervical adenopathy.  Neurological: She is alert. She has normal reflexes. No cranial nerve deficit. She exhibits normal muscle tone. Coordination normal.  Skin: Skin is warm and dry.  Psychiatric: She has a normal mood and affect. Her behavior is normal. Judgment and thought content normal.          Assessment & Plan:   healthy female healthy female  Sjogren's syndrome    Reflux esophagitis with history of esophageal stricture continue Protonix

## 2011-11-27 NOTE — Patient Instructions (Signed)
Continue your good health habits  Return in one year for general physical examination sooner if any problems  Call and gets set up for your mammogram

## 2011-11-28 LAB — CBC WITH DIFFERENTIAL/PLATELET
Eosinophils Relative: 0.8 % (ref 0.0–5.0)
HCT: 38.9 % (ref 36.0–46.0)
Hemoglobin: 13.7 g/dL (ref 12.0–15.0)
Lymphs Abs: 1.1 10*3/uL (ref 0.7–4.0)
MCV: 91.9 fl (ref 78.0–100.0)
Monocytes Absolute: 0.3 10*3/uL (ref 0.1–1.0)
Monocytes Relative: 7 % (ref 3.0–12.0)
Neutro Abs: 2.5 10*3/uL (ref 1.4–7.7)
RDW: 14.1 % (ref 11.5–14.6)
WBC: 3.9 10*3/uL — ABNORMAL LOW (ref 4.5–10.5)

## 2011-11-28 LAB — HEPATIC FUNCTION PANEL
ALT: 22 U/L (ref 0–35)
AST: 23 U/L (ref 0–37)
Albumin: 4 g/dL (ref 3.5–5.2)
Total Protein: 8.2 g/dL (ref 6.0–8.3)

## 2011-11-28 LAB — BASIC METABOLIC PANEL
Chloride: 104 mEq/L (ref 96–112)
GFR: 89.13 mL/min (ref >60.00)
Glucose, Bld: 77 mg/dL (ref 70–99)
Potassium: 4.3 mEq/L (ref 3.5–5.1)
Sodium: 137 mEq/L (ref 135–145)

## 2011-11-28 LAB — LIPID PANEL: Total CHOL/HDL Ratio: 3

## 2011-11-28 LAB — TSH: TSH: 0.6 u[IU]/mL (ref 0.35–5.50)

## 2012-01-22 ENCOUNTER — Encounter: Payer: Medicare Other | Admitting: Family Medicine

## 2012-04-16 ENCOUNTER — Ambulatory Visit (INDEPENDENT_AMBULATORY_CARE_PROVIDER_SITE_OTHER): Payer: Medicare Other | Admitting: Family Medicine

## 2012-04-16 ENCOUNTER — Encounter: Payer: Self-pay | Admitting: Family Medicine

## 2012-04-16 VITALS — BP 110/70 | Temp 98.8°F | Wt 126.0 lb

## 2012-04-16 DIAGNOSIS — J45901 Unspecified asthma with (acute) exacerbation: Secondary | ICD-10-CM | POA: Diagnosis not present

## 2012-04-16 DIAGNOSIS — J069 Acute upper respiratory infection, unspecified: Secondary | ICD-10-CM | POA: Diagnosis not present

## 2012-04-16 MED ORDER — HYDROCODONE-HOMATROPINE 5-1.5 MG/5ML PO SYRP: 5.0000 mL | ORAL_SOLUTION | Freq: Three times a day (TID) | ORAL | Status: AC | PRN

## 2012-04-16 MED ORDER — PREDNISONE 20 MG PO TABS: ORAL_TABLET | ORAL | Status: DC

## 2012-04-16 NOTE — Patient Instructions (Addendum)
Drink lots of water   take the prednisone as directed  Hydromet 1/2-1 teaspoon 3 times daily as needed for cough  Return when necessary

## 2012-04-16 NOTE — Progress Notes (Signed)
  Subjective:    Patient ID: Erika Jensen, female    DOB: 30-May-1946, 66 y.o.   MRN: [id number]  HPI Pats a 66 year old married female nonsmoker recently retired,,,,,,,, also my IT trainer for 30 years,,,,,,, who comes in today for evaluation of a cough  10 days ago she went to [place] had a cough it went away once they got in the [place]. This past Sunday on the way home she began coughing again. No fever or sputum production clear tightness in the chest and some soreness in her right ribs. No fever chills history of autoimmune Sjogren's syndrome  Review of Systems    general and pulmonary review of systems otherwise negative Objective:   Physical Exam Well-developed well-nourished female in no acute distress HEENT negative neck was supple no adenopathy lungs are clear except for symmetrical late expiratory mild wheezing       Assessment & Plan:  Viral syndrome with secondary asthma plan prednisone burst and taper return when necessary

## 2012-04-18 ENCOUNTER — Telehealth: Payer: Self-pay | Admitting: Family Medicine

## 2012-04-18 NOTE — Telephone Encounter (Signed)
  Caller: Erika Jensen/Patient; PCP: Roderick Pee.; CB#: [phone];  Call regarding Cough/Congestion; She saw him 04/16/12 for coughand he prescribe Prednisone and Cough med and then this AM she has noticed her lymph nodes are swollen on the R side. Triaged Sore Throat or Hoarseness. Disp = needs to be seen in 24 hrs for  new onset painful, swollen glands.   Afebrile. Cough is slightly better, no wheezing.  She took a Mucinex DM and is coughing up phlegm.   Dr. Tawanna Cooler has given her an anbx before.  SHE IS IN [place] AND [facility] [phone].  Please call ehr to advise.   Home care and call back inst given.

## 2012-04-18 NOTE — Telephone Encounter (Signed)
Dr Tawanna Cooler will call the patient

## 2012-10-16 DIAGNOSIS — H16229 Keratoconjunctivitis sicca, not specified as Sjogren's, unspecified eye: Secondary | ICD-10-CM | POA: Diagnosis not present

## 2012-12-22 ENCOUNTER — Encounter: Payer: Medicare Other | Admitting: Family Medicine

## 2013-01-19 ENCOUNTER — Other Ambulatory Visit: Payer: Self-pay | Admitting: Family Medicine

## 2013-02-02 ENCOUNTER — Encounter: Payer: Medicare Other | Admitting: Family Medicine

## 2013-04-02 ENCOUNTER — Telehealth: Payer: Self-pay | Admitting: Family Medicine

## 2013-04-02 NOTE — Telephone Encounter (Signed)
Pt would like a call from Dr Tawanna Cooler. Pt states non emergency.

## 2013-04-09 ENCOUNTER — Other Ambulatory Visit: Payer: Self-pay | Admitting: Family Medicine

## 2013-04-09 ENCOUNTER — Other Ambulatory Visit: Payer: Self-pay | Admitting: *Deleted

## 2013-04-09 DIAGNOSIS — J42 Unspecified chronic bronchitis: Secondary | ICD-10-CM

## 2013-04-09 MED ORDER — AMOXICILLIN 500 MG PO CAPS: 500.0000 mg | ORAL_CAPSULE | Freq: Three times a day (TID) | ORAL | Status: DC

## 2013-04-09 NOTE — Telephone Encounter (Signed)
Erika Bible has Sjogren's syndrome and gets recurrent upper airway infections that we treat with amoxicillin 500 mg 3 times a day for 10 days. She's going to [place] with her husband reck in their 2 grandchildren tomorrow and would like to take a prescription with her. [facility]

## 2013-04-27 ENCOUNTER — Other Ambulatory Visit (HOSPITAL_COMMUNITY)
Admission: RE | Admit: 2013-04-27 | Discharge: 2013-04-27 | Disposition: A | Discharge status: Home / Self Care | Payer: Medicare Other | Source: Ambulatory Visit | Attending: Family Medicine | Admitting: Family Medicine

## 2013-04-27 ENCOUNTER — Encounter: Payer: Self-pay | Admitting: Family Medicine

## 2013-04-27 ENCOUNTER — Ambulatory Visit (INDEPENDENT_AMBULATORY_CARE_PROVIDER_SITE_OTHER): Payer: Medicare Other | Admitting: Family Medicine

## 2013-04-27 VITALS — BP 98/64 | Temp 98.0°F | Ht 65.0 in | Wt 129.0 lb

## 2013-04-27 DIAGNOSIS — Z0000 Encounter for general adult medical examination without abnormal findings: Secondary | ICD-10-CM

## 2013-04-27 DIAGNOSIS — M3500 Sicca syndrome, unspecified: Secondary | ICD-10-CM | POA: Diagnosis not present

## 2013-04-27 DIAGNOSIS — K219 Gastro-esophageal reflux disease without esophagitis: Secondary | ICD-10-CM | POA: Diagnosis not present

## 2013-04-27 DIAGNOSIS — K222 Esophageal obstruction: Secondary | ICD-10-CM

## 2013-04-27 DIAGNOSIS — Z01419 Encounter for gynecological examination (general) (routine) without abnormal findings: Secondary | ICD-10-CM

## 2013-04-27 DIAGNOSIS — D72819 Decreased white blood cell count, unspecified: Secondary | ICD-10-CM

## 2013-04-27 DIAGNOSIS — Z124 Encounter for screening for malignant neoplasm of cervix: Secondary | ICD-10-CM | POA: Insufficient documentation

## 2013-04-27 LAB — CBC WITH DIFFERENTIAL/PLATELET
Basophils Relative: 0.7 % (ref 0.0–3.0)
Eosinophils Absolute: 0.1 10*3/uL (ref 0.0–0.7)
HCT: 40.8 % (ref 36.0–46.0)
Hemoglobin: 13.8 g/dL (ref 12.0–15.0)
Lymphs Abs: 1.1 10*3/uL (ref 0.7–4.0)
MCHC: 33.9 g/dL (ref 30.0–36.0)
MCV: 94.3 fl (ref 78.0–100.0)
Monocytes Absolute: 0.3 10*3/uL (ref 0.1–1.0)
Neutro Abs: 1.6 10*3/uL (ref 1.4–7.7)
RBC: 4.33 Mil/uL (ref 3.87–5.11)

## 2013-04-27 LAB — POCT URINALYSIS DIPSTICK
Blood, UA: NEGATIVE
Ketones, UA: NEGATIVE
Protein, UA: NEGATIVE
Spec Grav, UA: 1.01
Urobilinogen, UA: 0.2

## 2013-04-27 LAB — HEPATIC FUNCTION PANEL
ALT: 13 U/L (ref 0–35)
Bilirubin, Direct: 0 mg/dL (ref 0.0–0.3)
Total Bilirubin: 0.5 mg/dL (ref 0.3–1.2)

## 2013-04-27 LAB — BASIC METABOLIC PANEL
CO2: 27 mEq/L (ref 19–32)
Calcium: 9.1 mg/dL (ref 8.4–10.5)
Chloride: 103 mEq/L (ref 96–112)
Creatinine, Ser: 0.8 mg/dL (ref 0.4–1.2)
Glucose, Bld: 86 mg/dL (ref 70–99)

## 2013-04-27 MED ORDER — ESTROGENS, CONJUGATED 0.625 MG/GM VA CREA: TOPICAL_CREAM | Freq: Every day | VAGINAL | Status: AC

## 2013-04-27 MED ORDER — PANTOPRAZOLE SODIUM 20 MG PO TBEC: DELAYED_RELEASE_TABLET | ORAL | Status: DC

## 2013-04-27 NOTE — Patient Instructions (Signed)
BSE monthly  Call and get set up for mammogram this summer  Continue your current medication,,,,,,,,, proton X. One daily  A small amounts of Premarin cream 3 times weekly  Call your insurance company to find out where you can get the best Squyres on your shingles vaccine  Return in one year sooner if any problems

## 2013-04-27 NOTE — Progress Notes (Signed)
  Subjective:    Patient ID: Erika Jensen, female    DOB: August 25, 1946, 67 y.o.   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 She says she feels well except for dryness in her mouth.  She's also history of reflux with esophageal stricture and is on Protonix daily.. Asymptomatic able to swallow  She's also had a history of leukopenia ED LG unknown  She gets routine eye care, dental care, occasional BSE, last mammogram 2 years ago, last Pap smear 3 years ago  Colonoscopy normal  Cognitive function normal she exercises on regular basis home health safety reviewed no issues identified, no guns in the house, she does have a health care power of attorney and living well    Review of Systems  Constitutional: Negative.   HENT: Negative.   Eyes: Negative.   Respiratory: Negative.   Cardiovascular: Negative.   Gastrointestinal: Negative.   Genitourinary: Negative.   Musculoskeletal: Negative.   Neurological: Negative.   Psychiatric/Behavioral: Negative.        Objective:   Physical Exam  Constitutional: She appears well-developed and well-nourished.  HENT:  Head: Normocephalic and atraumatic.  Right Ear: External ear normal.  Left Ear: External ear normal.  Nose: Nose normal.  Mouth/Throat: Oropharynx is clear and moist.  Eyes: EOM are normal. Pupils are equal, round, and reactive to light.  Neck: Normal range of motion. Neck supple. No thyromegaly present.  Cardiovascular: Normal rate, regular rhythm, normal heart sounds and intact distal pulses.  Exam reveals no gallop and no friction rub.   No murmur heard. No aortic bruits peripheral pulses 2+ and symmetrical  Pulmonary/Chest: Effort normal and breath sounds normal.  Abdominal: Soft. Bowel sounds are normal. She exhibits no distension and no mass. There is no  tenderness. There is no rebound.  Genitourinary: Vagina normal and uterus normal. Guaiac negative stool. No vaginal discharge found.  Moderate vaginal drynessBilateral breast exam normal advised to do BSE monthly and get annual mammography,,,,,,  Musculoskeletal: Normal range of motion.  Lymphadenopathy:    She has no cervical adenopathy.  Neurological: She is alert. She has normal reflexes. No cranial nerve deficit. She exhibits normal muscle tone. Coordination normal.  Skin: Skin is warm and dry.  Total body skin exam normal  Psychiatric: She has a normal mood and affect. Her behavior is normal. Judgment and thought content normal.          Assessment & Plan:  Healthy female  Sjogren's syndrome  Reflux esophagitis with history of stricture continue Protonix daily  Postmenopausal vaginal dryness Premarin vaginal cream 3 times weekly

## 2013-08-27 ENCOUNTER — Other Ambulatory Visit: Payer: Self-pay

## 2013-11-24 DIAGNOSIS — H18419 Arcus senilis, unspecified eye: Secondary | ICD-10-CM | POA: Diagnosis not present

## 2013-11-24 DIAGNOSIS — H35379 Puckering of macula, unspecified eye: Secondary | ICD-10-CM | POA: Diagnosis not present

## 2013-11-24 DIAGNOSIS — H25019 Cortical age-related cataract, unspecified eye: Secondary | ICD-10-CM | POA: Diagnosis not present

## 2013-11-24 DIAGNOSIS — H2510 Age-related nuclear cataract, unspecified eye: Secondary | ICD-10-CM | POA: Diagnosis not present

## 2013-12-28 ENCOUNTER — Other Ambulatory Visit: Payer: Self-pay | Admitting: Family Medicine

## 2014-07-29 ENCOUNTER — Ambulatory Visit (INDEPENDENT_AMBULATORY_CARE_PROVIDER_SITE_OTHER): Payer: Medicare Other | Admitting: Family Medicine

## 2014-07-29 ENCOUNTER — Encounter: Payer: Self-pay | Admitting: Family Medicine

## 2014-07-29 VITALS — BP 104/80 | Temp 97.8°F | Ht 65.5 in | Wt 122.8 lb

## 2014-07-29 DIAGNOSIS — Z0000 Encounter for general adult medical examination without abnormal findings: Secondary | ICD-10-CM

## 2014-07-29 DIAGNOSIS — M3500 Sicca syndrome, unspecified: Secondary | ICD-10-CM

## 2014-07-29 DIAGNOSIS — K219 Gastro-esophageal reflux disease without esophagitis: Secondary | ICD-10-CM

## 2014-07-29 DIAGNOSIS — K222 Esophageal obstruction: Secondary | ICD-10-CM

## 2014-07-29 DIAGNOSIS — Z23 Encounter for immunization: Secondary | ICD-10-CM | POA: Diagnosis not present

## 2014-07-29 LAB — POCT URINALYSIS DIPSTICK
Bilirubin, UA: NEGATIVE
Glucose, UA: NEGATIVE
Ketones, UA: NEGATIVE
Leukocytes, UA: NEGATIVE
NITRITE UA: NEGATIVE
Protein, UA: NEGATIVE
RBC UA: NEGATIVE
Spec Grav, UA: 1.015
UROBILINOGEN UA: 0.2
pH, UA: 7.5

## 2014-07-29 LAB — CBC WITH DIFFERENTIAL/PLATELET
BASOS ABS: 0 10*3/uL (ref 0.0–0.1)
Basophils Relative: 0.6 % (ref 0.0–3.0)
EOS ABS: 0.1 10*3/uL (ref 0.0–0.7)
Eosinophils Relative: 1.8 % (ref 0.0–5.0)
HCT: 41.6 % (ref 36.0–46.0)
Hemoglobin: 14.1 g/dL (ref 12.0–15.0)
Lymphocytes Relative: 29.9 % (ref 12.0–46.0)
Lymphs Abs: 1 10*3/uL (ref 0.7–4.0)
MCHC: 33.8 g/dL (ref 30.0–36.0)
MCV: 91.8 fl (ref 78.0–100.0)
Monocytes Absolute: 0.4 10*3/uL (ref 0.1–1.0)
Monocytes Relative: 10.5 % (ref 3.0–12.0)
NEUTROS PCT: 57.2 % (ref 43.0–77.0)
Neutro Abs: 1.9 10*3/uL (ref 1.4–7.7)
Platelets: 251 10*3/uL (ref 150.0–400.0)
RBC: 4.54 Mil/uL (ref 3.87–5.11)
RDW: 13.3 % (ref 11.5–15.5)
WBC: 3.4 10*3/uL — ABNORMAL LOW (ref 4.0–10.5)

## 2014-07-29 LAB — HEPATIC FUNCTION PANEL
ALK PHOS: 71 U/L (ref 39–117)
ALT: 11 U/L (ref 0–35)
AST: 20 U/L (ref 0–37)
Albumin: 3.5 g/dL (ref 3.5–5.2)
BILIRUBIN DIRECT: 0.1 mg/dL (ref 0.0–0.3)
TOTAL PROTEIN: 8.6 g/dL — AB (ref 6.0–8.3)
Total Bilirubin: 0.6 mg/dL (ref 0.2–1.2)

## 2014-07-29 LAB — BASIC METABOLIC PANEL
BUN: 11 mg/dL (ref 6–23)
CALCIUM: 9.4 mg/dL (ref 8.4–10.5)
CO2: 28 mEq/L (ref 19–32)
CREATININE: 0.8 mg/dL (ref 0.4–1.2)
Chloride: 104 mEq/L (ref 96–112)
GFR: 72.63 mL/min (ref >60.00)
GLUCOSE: 92 mg/dL (ref 70–99)
Potassium: 5.4 mEq/L — ABNORMAL HIGH (ref 3.5–5.1)
Sodium: 136 mEq/L (ref 135–145)

## 2014-07-29 LAB — TSH: TSH: 1.71 u[IU]/mL (ref 0.35–4.50)

## 2014-07-29 MED ORDER — PANTOPRAZOLE SODIUM 20 MG PO TBEC: DELAYED_RELEASE_TABLET | ORAL | Status: DC

## 2014-07-29 NOTE — Progress Notes (Signed)
Pre visit review using our clinic review tool, if applicable. No additional management support is needed unless otherwise documented below in the visit note. 

## 2014-07-29 NOTE — Patient Instructions (Signed)
Continue current medication  If you have any difficulty swallowing call GI  If you would like a nutrition consult please let me know  Followup in 1 year sooner if any problems  BSE monthly, call and get set up for mammogram

## 2014-07-29 NOTE — Progress Notes (Signed)
   Subjective:    Patient ID: Erika Jensen, female    DOB: 1946/04/12, 68 y.o.   MRN: [id number]  HPI Erika Jensen is a 68 year old married female nonsmoker who comes in today for evaluation of Sjogren's syndrome  She's had Sjogren's syndrome for many years. 3 years ago she lost her sense of smell. She's had some weight loss related to decreased appetite. Weight is stable at 122. We discussed nutrition consult however she's think she is okay at this juncture.  She gets routine eye care, dental care, does not do BSE monthly, hasn't had a mammogram a couple years, did have a colonoscopy was normal.  She's had no difficulty swallowing. She has had a history of reflux or hiatal hernia and a stricture.  Vaccinations updated  Pap last year which was normal. Based on her negative history and normal Paps she would fall under the every 3 your category for Paps and pelvics at this juncture    Review of Systems  Constitutional: Negative.   HENT: Negative.   Eyes: Negative.   Respiratory: Negative.   Cardiovascular: Negative.   Gastrointestinal: Negative.   Genitourinary: Negative.   Musculoskeletal: Negative.   Neurological: Negative.   Psychiatric/Behavioral: Negative.        Objective:   Physical Exam  Nursing note and vitals reviewed. Constitutional: She appears well-developed and well-nourished.  HENT:  Head: Normocephalic and atraumatic.  Right Ear: External ear normal.  Left Ear: External ear normal.  Nose: Nose normal.  Mouth/Throat: Oropharynx is clear and moist.  Eyes: EOM are normal. Pupils are equal, round, and reactive to light.  Neck: Normal range of motion. Neck supple. No thyromegaly present.  Cardiovascular: Normal rate, regular rhythm, normal heart sounds and intact distal pulses.  Exam reveals no gallop and no friction rub.   No murmur heard. Pulmonary/Chest: Effort normal and breath sounds normal.  Abdominal: Soft. Bowel sounds are normal. She exhibits no distension  and no mass. There is no tenderness. There is no rebound.  Genitourinary:  Bilateral breast exam normal  Musculoskeletal: Normal range of motion.  Lymphadenopathy:    She has no cervical adenopathy.  Neurological: She is alert. She has normal reflexes. No cranial nerve deficit. She exhibits normal muscle tone. Coordination normal.  Skin: Skin is warm and dry.  Total body skin exam normal  Psychiatric: She has a normal mood and affect. Her behavior is normal. Judgment and thought content normal.          Assessment & Plan:  Healthy female  Sjogren's syndrome.......... clinically stable......... continue Protonix daily followup in 1 year sooner if any problems

## 2014-10-18 ENCOUNTER — Telehealth: Payer: Self-pay | Admitting: *Deleted

## 2014-10-18 NOTE — Telephone Encounter (Signed)
[facility] - Client [facility] Patient Name: Erika Jensen Gender: Female DOB: 04-Dec-1945 Age: 68 Y 72 M 13 D Return Phone Number: [phone] (Primary), [phone] (Secondary) Address: [place]. City/State/Zip: [place] [address] Client [facility] - Client Client Site [facility] Day Physician [name] Contact Type Call Call Type Triage / Clinical Relationship To Patient Self Return Phone Number [phone] (Primary) Chief Complaint Knee Injury Initial Comment Caller states she hurt her leg a month ago, has water on the knee. PreDisposition Call Doctor Nurse Assessment Nurse: Ronnald Ramp, RN, Miranda Date/Time Eilene Ghazi Time): 10/18/2014 11:18:46 AM Confirm and document reason for call. If symptomatic, describe symptoms. ---Caller states about 1 month ago she fell while she was hiking. She landed on her right knee but feels like she pulled something in her left knee. She has is having stiffness and then about 3 days ago noticed swelling in her left knee. Not limiting her activity. Has the patient traveled out of the country within the last 30 days? ---Not Applicable Does the patient require triage? ---Yes Related visit to physician within the last 2 weeks? ---No Does the PT have any chronic conditions? (i.e. diabetes, asthma, etc.) ---Yes List chronic conditions. ---Sjogren's syndrome Guidelines Guideline Title Affirmed Question Affirmed Notes Nurse Date/Time (Eastern Time) Knee Injury [1] After 2 weeks AND [2] still painful or swollen Ronnald Ramp, RN, Miranda 10/18/2014 11:21:01 AM Disp. Time Eilene Ghazi Time) Disposition Final User 10/18/2014 11:23:04 AM See PCP When Office is Open (within 3 days) Yes Ronnald Ramp, RN, Judge Stall Understands: Yes Disagree/Comply: Comply PLEASE NOTE: All timestamps contained within this report are represented as [place]  Standard Time. CONFIDENTIALTY NOTICE: This fax transmission is intended only for the addressee. It contains information that is legally privileged, confidential or otherwise protected from use or disclosure. If you are not the intended recipient, you are strictly prohibited from reviewing, disclosing, copying using or disseminating any of this information or taking any action in reliance on or regarding this information. If you have received this fax in error, please notify us immediately by telephone so that we can arrange for its return to [place]. Phone: [phone], Toll-Free: [phone], Fax: [phone] Page: 2 of 2 Call Id: [id number] Care Advice Given Per Guideline SEE PCP WITHIN 3 DAYS: You need to be examined within 2 or 3 days. Call your doctor during regular office hours and make an appointment. (Note: if office will be open tomorrow, tell caller to call then, not in 3 days). PAIN MEDICINES: IBUPROFEN (E.G., MOTRIN, ADVIL): NAPROXEN (E.G., ALEVE): CALL BACK IF: * Pain becomes severe * You become worse. CARE ADVICE given per Knee Injury (Adult) guideline. After Care Instructions Given Call Event Type User Date / Time Description

## 2014-10-26 ENCOUNTER — Encounter: Payer: Self-pay | Admitting: Family Medicine

## 2014-10-26 ENCOUNTER — Ambulatory Visit (INDEPENDENT_AMBULATORY_CARE_PROVIDER_SITE_OTHER): Payer: Medicare Other | Admitting: Family Medicine

## 2014-10-26 VITALS — BP 120/86 | Temp 97.6°F | Wt 126.0 lb

## 2014-10-26 DIAGNOSIS — M25562 Pain in left knee: Secondary | ICD-10-CM | POA: Insufficient documentation

## 2014-10-26 DIAGNOSIS — S83207A Unspecified tear of unspecified meniscus, current injury, left knee, initial encounter: Secondary | ICD-10-CM | POA: Diagnosis not present

## 2014-10-26 NOTE — Patient Instructions (Signed)
Elevation and ice in the evening when necessary  Avoid any activity that causes pain  If you develop locking or the pain persists call and we will get you set up to see an orthopedist

## 2014-10-26 NOTE — Progress Notes (Signed)
   Subjective:    Patient ID: Erika Jensen, female    DOB: Aug 31, 1946, 69 y.o.   MRN: [id number]  HPI Erika Jensen is a 69 year old married female nonsmoker who comes in today for evaluation of left knee pain  She says she was [place] over [place] weekend and fell on her right knee. Her right knee was swollen and sore for couple days and that eventually got better. About 3 days after the incident she began and pain in the medial portion of her left knee. She did not fall on that knee.  Right knee is healed completely but she still having some soreness medial anterior portion left knee and some slight swelling. No locking. No history of any previous injury to that knee.   Review of Systems    review of systems otherwise negative Objective:   Physical Exam  Well-developed well-nourished female no acute distress vital signs stable she's afebrile examination left knee in the sitting position shows no obvious erythema or swelling. Ligaments are intact. There is some tenderness medial anterior joint line      Assessment & Plan:  Probable small tear medial anterior left cartilage without effusion or locking......... advised exercise elevation and ice orthopedic consult in 2-3 months if symptoms persist.

## 2014-10-26 NOTE — Progress Notes (Signed)
Pre visit review using our clinic review tool, if applicable. No additional management support is needed unless otherwise documented below in the visit note. 

## 2014-12-28 ENCOUNTER — Other Ambulatory Visit: Payer: Self-pay | Admitting: Family Medicine

## 2015-01-27 ENCOUNTER — Ambulatory Visit (INDEPENDENT_AMBULATORY_CARE_PROVIDER_SITE_OTHER): Payer: Medicare Other | Admitting: Sports Medicine

## 2015-01-27 ENCOUNTER — Encounter: Payer: Self-pay | Admitting: Sports Medicine

## 2015-01-27 VITALS — BP 136/87 | HR 103 | Ht 65.0 in | Wt 124.0 lb

## 2015-01-27 DIAGNOSIS — T7029XA Other effects of high altitude, initial encounter: Secondary | ICD-10-CM

## 2015-01-27 DIAGNOSIS — D751 Secondary polycythemia: Secondary | ICD-10-CM | POA: Diagnosis not present

## 2015-01-27 DIAGNOSIS — M25562 Pain in left knee: Secondary | ICD-10-CM | POA: Diagnosis not present

## 2015-01-27 MED ORDER — ACETAZOLAMIDE 125 MG PO TABS: 250.0000 mg | ORAL_TABLET | Freq: Two times a day (BID) | ORAL | Status: DC | PRN

## 2015-01-27 NOTE — Assessment & Plan Note (Signed)
Patient's PCP is out of the office for the next several months, will send then on behalf of him for treatment dose Diamox if needed. Recommend slow sent and appropriate altitude acclimatization. No evidence of contraindication's with Diamox due to Sjogren's.

## 2015-01-27 NOTE — Patient Instructions (Signed)
Side laying hip exercises Start with 2 sets of 10 per day, increased to 3 sets of 15 over the next 3 weeks  Straight leg raises: Keep your knee completely straight and while sitting or lying with her foot approximately foot and half off the ground and slowly return to neutral. Once again start with 2 sets of 10 per day and increase to 3 sets of 15 over the next 3 weeks.

## 2015-01-27 NOTE — Progress Notes (Signed)
Erika Jensen - 69 y.o. female MRN [id number]  Date of birth: 07/04/1946  SUBJECTIVE: CC: 1. Left knee pain, initial eval, referred by Friend 2. History of Acute Mountain Sickness      HPI:   patient reports left-sided medial knee pain that occurred following a fall at Thanksgiving of 2015. Using anti-inflammatories and therapeutic exercises pain is essentially resolved but will occasionally have a small amount of pain with twisting activities  Previously seen by her PCP Dr. Sherren Mocha  & felt to have a partial meniscal tear   Currently she is essentially asymptomatic with no symptoms over the past 2 weeks however she has an upcoming trip to [place] in June [place] and would like her knee further evaluated prior to this trip  Denies numbness, tingling or giving way, clicking and popping or locking of the left knee or leg.     ROS:  per HPI   HISTORY:  Past Medical, Surgical, Social, and Family History reviewed & updated per EMR.  Pertinent Historical Findings include: Social History   Occupational History  . Former Engineer, maintenance (IT), retired    Social History Main Topics  . Smoking status: Never Smoker   . Smokeless tobacco: Not on file  . Alcohol Use: No  . Drug Use: No  . Sexual Activity: Not on file    No specialty comments available. Problem  Acute Mountain Sickness   Prior emergency room visit while in [place] due to acute mountain sickness Upcoming trip to [place] in June 2016   Knee Pain, Left   Status post fall in November 2015 Medial knee pain No prior knee issues, conservative care only, no imaging.    OBJECTIVE:  VS:   HT:5\' 5"  (165.1 cm)   WT:124 lb (56.246 kg)  BMI:20.7          BP:136/87 mmHg  HR:(!) 103bpm  TEMP: ( )  RESP:   PHYSICAL EXAM: GENERAL: Adult Caucasian female. No acute distress PSYCH: Alert and appropriately interactive. SKIN: No open skin lesions or abnormal skin markings on areas inspected as below VASCULAR: No significant pretibial  edema NEURO: Lower extremity strength is 5+/5 in all myotomes; sensation is intact to light touch in all dermatomes. LEFT KNEE: Overall well aligned no significant effusion. Good VMO definition bilaterally. Positive patellar grind. Stable to varus and valgus strain, anterior posterior drawer. She has slight amount of medial joint line tenderness over the anterior aspect. No lateral tenderness. No past tenderness. Extensor mechanism strength 5+/5. No Baker's cyst. Normal Thessaly, McMurray's. ROM: 0 to 135.  Left-sided hip abduction strength 5+/5. Right-sided 5 -/5.   DATA OBTAINED: No notes on file  ASSESSMENT & PLAN: See problem based charting & AVS for additional documentation Problem List Items Addressed This Visit    Knee pain, left - Primary    She does have positive patellar grind and medial joint line tenderness. I suspect underlying degenerative changes No significant symptoms over the past 2 months she is concerned about going on her upcoming trip. Weak right-sided hip abduction and with slight Trendelenburg. HEP: Hip abduction strengthening and quad sets Avoid deep knee bends, continue using hiking poles with activity      Acute mountain sickness    Patient's PCP is out of the office for the next several months, will send then on behalf of him for treatment dose Diamox if needed. Recommend slow sent and appropriate altitude acclimatization. No evidence of contraindication's with Diamox due to Sjogren's.  Relevant Medications   acetaZOLAMIDE (DIAMOX) tablet     FOLLOW UP:  Return if symptoms worsen or fail to improve.

## 2015-01-27 NOTE — Assessment & Plan Note (Signed)
She does have positive patellar grind and medial joint line tenderness. I suspect underlying degenerative changes No significant symptoms over the past 2 months she is concerned about going on her upcoming trip. Weak right-sided hip abduction and with slight Trendelenburg. HEP: Hip abduction strengthening and quad sets Avoid deep knee bends, continue using hiking poles with activity

## 2015-02-17 ENCOUNTER — Ambulatory Visit: Payer: Medicare Other | Admitting: Sports Medicine

## 2015-03-14 ENCOUNTER — Encounter: Payer: Self-pay | Admitting: Internal Medicine

## 2015-03-23 ENCOUNTER — Telehealth: Payer: Self-pay | Admitting: *Deleted

## 2015-03-23 NOTE — Telephone Encounter (Signed)
Left a message for the pt to call the office back (need date of most recent mammogram). 

## 2015-03-24 ENCOUNTER — Telehealth: Payer: Self-pay | Admitting: Family Medicine

## 2015-03-24 MED ORDER — CIPROFLOXACIN HCL 500 MG PO TABS: 500.0000 mg | ORAL_TABLET | Freq: Two times a day (BID) | ORAL | Status: DC

## 2015-03-24 NOTE — Telephone Encounter (Signed)
Pt called to say that she contacted the pharmacy and they do not have the rx for diarrhea   Pharmacy [place]

## 2015-03-24 NOTE — Telephone Encounter (Signed)
Rx sent please let the patient know

## 2015-03-25 NOTE — Telephone Encounter (Signed)
Pt contacted and is aware rx is ready for pu

## 2015-06-06 ENCOUNTER — Encounter: Payer: Self-pay | Admitting: *Deleted

## 2015-07-18 ENCOUNTER — Ambulatory Visit (INDEPENDENT_AMBULATORY_CARE_PROVIDER_SITE_OTHER): Payer: Medicare Other | Admitting: Family Medicine

## 2015-07-18 ENCOUNTER — Encounter: Payer: Self-pay | Admitting: Family Medicine

## 2015-07-18 VITALS — BP 130/82 | HR 102 | Temp 98.2°F | Ht 65.0 in | Wt 122.8 lb

## 2015-07-18 DIAGNOSIS — J069 Acute upper respiratory infection, unspecified: Secondary | ICD-10-CM | POA: Diagnosis not present

## 2015-07-18 MED ORDER — AMOXICILLIN 500 MG PO CAPS: 500.0000 mg | ORAL_CAPSULE | Freq: Two times a day (BID) | ORAL | Status: DC

## 2015-07-18 NOTE — Patient Instructions (Signed)
INSTRUCTIONS FOR UPPER RESPIRATORY INFECTION:  -plenty of rest and fluids  -nasal saline wash 2-3 times daily (use prepackaged nasal saline or bottled/distilled water if making your own)   -use a normal voice - do not whisper or shout  -in the winter time, using a humidifier at night is helpful (please follow cleaning instructions)  -for sore throat, salt water gargles can help  -follow up if you have fevers, facial pain, tooth pain, difficulty breathing or are worsening or symptoms persist longer then expected  Upper Respiratory Infection, Adult An upper respiratory infection (URI) is also known as the common cold. It is often caused by a type of germ (virus). Colds are easily spread (contagious). You can pass it to others by kissing, coughing, sneezing, or drinking out of the same glass. Usually, you get better in 1 to 3  weeks.  However, the cough can last for even longer. HOME CARE   Only take medicine as told by your doctor. Follow instructions provided above.  Drink enough water and fluids to keep your pee (urine) clear or pale yellow.  Get plenty of rest.  Return to work when your temperature is < 100 for 24 hours or as told by your doctor. You may use a face mask and wash your hands to stop your cold from spreading. GET HELP RIGHT AWAY IF:   After the first few days, you feel you are getting worse.  You have questions about your medicine.  You have chills, shortness of breath, or red spit (mucus).  You have pain in the face for more then 1-2 days, especially when you bend forward.  You have a fever, puffy (swollen) neck, pain when you swallow, or white spots in the back of your throat.  You have a bad headache, ear pain, sinus pain, or chest pain.  You have a high-pitched whistling sound when you breathe in and out (wheezing).  You cough up blood.  You have sore muscles or a stiff neck. MAKE SURE YOU:   Understand these instructions.  Will watch your  condition.  Will get help right away if you are not doing well or get worse. Document Released: 03/26/2008 Document Revised: 12/31/2011 Document Reviewed: 01/13/2014 [facility] Patient Information 2015 [place], [place]. This information is not intended to replace advice given to you by your health care provider. Make sure you discuss any questions you have with your health care provider.

## 2015-07-18 NOTE — Progress Notes (Signed)
Pre visit review using our clinic review tool, if applicable. No additional management support is needed unless otherwise documented below in the visit note. 

## 2015-07-18 NOTE — Progress Notes (Signed)
HPI:  Upper respiratory infection: -started: 5 days ago -symptoms:nasal congestion, sore throat, cough, laryngitis-now improving -denies:fever, SOB, NVD, tooth pain -has tried: Nothing -sick contacts/travel/risks: denies flu exposure, tick exposure or or Ebola risks -Hx of: Sjogren's syndrome and reports her PCP always gives her an antibiotic for these symptoms and requests an antibiotic in case she is getting worse ROS: See pertinent positives and negatives per HPI.  Past Medical History  Diagnosis Date  . Sjoegren syndrome   . Esophageal stricture   . Hernia     b/l    Past Surgical History  Procedure Laterality Date  . Hernia repair      b/l   . Intrauterine device insertion      Family History  Problem Relation Age of Onset  . Dementia Mother   . Hypertension Mother   . Seizures Father   . Cancer Father     renal cell  . Alcohol abuse Other   . Anxiety disorder Other   . Hypertension Other     Social History   Social History  . Marital Status: Married    Spouse Name: N/A  . Number of Children: N/A  . Years of Education: N/A   Occupational History  . Former Engineer, maintenance (IT), retired    Social History Main Topics  . Smoking status: Never Smoker   . Smokeless tobacco: None  . Alcohol Use: No  . Drug Use: No  . Sexual Activity: Not Asked   Other Topics Concern  . None   Social History Narrative     Current outpatient prescriptions:  .  acetaZOLAMIDE (DIAMOX) 125 MG tablet, Take 2 tablets (250 mg total) by mouth 2 (two) times daily as needed., Disp: 30 tablet, Rfl: 0 .  amoxicillin (AMOXIL) 500 MG capsule, Take 1 capsule (500 mg total) by mouth 2 (two) times daily., Disp: 20 capsule, Rfl: 0  EXAM:  Filed Vitals:   07/18/15 1340  BP: 130/82  Pulse: 102  Temp: 98.2 F (36.8 C)    Body mass index is 20.44 kg/(m^2).  GENERAL: vitals reviewed and listed above, alert, oriented, appears well hydrated and in no acute distress  HEENT: atraumatic,  conjunttiva clear, no obvious abnormalities on inspection of external nose and ears, normal appearance of ear canals and TMs, clear nasal congestion, mild post oropharyngeal erythema with PND, no tonsillar edema or exudate, no sinus TTP  NECK: no obvious masses on inspection  LUNGS: clear to auscultation bilaterally, no wheezes, rales or rhonchi, good air movement  CV: HRRR, no peripheral edema  MS: moves all extremities without noticeable abnormality  PSYCH: pleasant and cooperative, no obvious depression or anxiety  ASSESSMENT AND PLAN:  Discussed the following assessment and plan:  Acute upper respiratory infection  -given HPI and exam findings today, a serious infection or illness is unlikely. We discussed potential etiologies, with VURI being most likely, and advised supportive care and monitoring. We discussed treatment side effects, likely course, antibiotic misuse, transmission, and signs of developing a serious illness. -She insisted on a prescription for an antibiotic in case she is worsening or showing any signs of a bacterial infection over the course of the next few days, discussed risk and benefits of antibiotics and signs of a bacterial infection. Advised to shred the prescription if she does not use it. -of course, we advised to return or notify a doctor immediately if symptoms worsen or persist or new concerns arise.    Patient Instructions  INSTRUCTIONS FOR UPPER RESPIRATORY INFECTION:  -  plenty of rest and fluids  -nasal saline wash 2-3 times daily (use prepackaged nasal saline or bottled/distilled water if making your own)   -use a normal voice - do not whisper or shout  -in the winter time, using a humidifier at night is helpful (please follow cleaning instructions)  -for sore throat, salt water gargles can help  -follow up if you have fevers, facial pain, tooth pain, difficulty breathing or are worsening or symptoms persist longer then expected  Upper  Respiratory Infection, Adult An upper respiratory infection (URI) is also known as the common cold. It is often caused by a type of germ (virus). Colds are easily spread (contagious). You can pass it to others by kissing, coughing, sneezing, or drinking out of the same glass. Usually, you get better in 1 to 3  weeks.  However, the cough can last for even longer. HOME CARE   Only take medicine as told by your doctor. Follow instructions provided above.  Drink enough water and fluids to keep your pee (urine) clear or pale yellow.  Get plenty of rest.  Return to work when your temperature is < 100 for 24 hours or as told by your doctor. You may use a face mask and wash your hands to stop your cold from spreading. GET HELP RIGHT AWAY IF:   After the first few days, you feel you are getting worse.  You have questions about your medicine.  You have chills, shortness of breath, or red spit (mucus).  You have pain in the face for more then 1-2 days, especially when you bend forward.  You have a fever, puffy (swollen) neck, pain when you swallow, or white spots in the back of your throat.  You have a bad headache, ear pain, sinus pain, or chest pain.  You have a high-pitched whistling sound when you breathe in and out (wheezing).  You cough up blood.  You have sore muscles or a stiff neck. MAKE SURE YOU:   Understand these instructions.  Will watch your condition.  Will get help right away if you are not doing well or get worse. Document Released: 03/26/2008 Document Revised: 12/31/2011 Document Reviewed: 01/13/2014 [facility] Patient Information 2015 [place], [place]. This information is not intended to replace advice given to you by your health care provider. Make sure you discuss any questions you have with your health care provider.      Colin Benton R.

## 2015-07-25 ENCOUNTER — Encounter: Payer: Self-pay | Admitting: Family Medicine

## 2015-07-25 ENCOUNTER — Ambulatory Visit (INDEPENDENT_AMBULATORY_CARE_PROVIDER_SITE_OTHER): Payer: Medicare Other | Admitting: Family Medicine

## 2015-07-25 VITALS — BP 120/80 | HR 80 | Temp 98.0°F | Wt 119.7 lb

## 2015-07-25 DIAGNOSIS — R0989 Other specified symptoms and signs involving the circulatory and respiratory systems: Secondary | ICD-10-CM

## 2015-07-25 DIAGNOSIS — J069 Acute upper respiratory infection, unspecified: Secondary | ICD-10-CM | POA: Diagnosis not present

## 2015-07-25 DIAGNOSIS — J989 Respiratory disorder, unspecified: Secondary | ICD-10-CM | POA: Insufficient documentation

## 2015-07-25 MED ORDER — HYDROCODONE-HOMATROPINE 5-1.5 MG/5ML PO SYRP: 5.0000 mL | ORAL_SOLUTION | Freq: Three times a day (TID) | ORAL | Status: DC | PRN

## 2015-07-25 MED ORDER — PREDNISONE 20 MG PO TABS: ORAL_TABLET | ORAL | Status: DC

## 2015-07-25 NOTE — Patient Instructions (Signed)
Prednisone 20 mg........... 2 tabs 3 days taper as outlined  Hydromet 1/2-1 teaspoon at bedtime when necessary for nighttime cough  Drink lots of water vaporizer or humidifier in your bedroom at night  Return when necessary  Stop the antibiotic

## 2015-07-25 NOTE — Progress Notes (Signed)
   Subjective:    Patient ID: Erika Jensen, female    DOB: 10-24-1945, 69 y.o.   MRN: [id number]  HPI Erika Jensen is a 69 year old married female nonsmoker who comes in today with a persistent cough  She said she went to [place] a couple weeks ago developed laryngitis for 4 days and then started to have a cough. She also initiated some fever chills one big snow night sweats. She has sore glands it was swollen but they went away everything is gotten better but the cough this persisted. She saw Dr. Ninfa Meeker a week ago. She empirically gave her some amoxicillin to take when necessary. She took it darting 5 days ago but it hasn't helped. She has no fever sputum production except for normal sputum production when she gets up in the morning because of her Sjogren's syndrome and dry mouth   Review of Systems    review of systems otherwise negative Objective:   Physical Exam  Well-developed well-nourished female no acute distress vital signs stable she's afebrile HEENT were negative neck was supple no adenopathy lungs are clear no wheezing      Assessment & Plan:  Viral syndrome with reactive airway disease........Marland Kitchen prednisone burst and taper

## 2015-07-25 NOTE — Progress Notes (Signed)
Pre visit review using our clinic review tool, if applicable. No additional management support is needed unless otherwise documented below in the visit note. 

## 2015-07-27 ENCOUNTER — Ambulatory Visit: Payer: Medicare Other | Admitting: Family Medicine

## 2015-08-30 DIAGNOSIS — H35371 Puckering of macula, right eye: Secondary | ICD-10-CM | POA: Diagnosis not present

## 2015-08-30 DIAGNOSIS — H04129 Dry eye syndrome of unspecified lacrimal gland: Secondary | ICD-10-CM | POA: Diagnosis not present

## 2015-08-30 DIAGNOSIS — H18411 Arcus senilis, right eye: Secondary | ICD-10-CM | POA: Diagnosis not present

## 2015-08-30 DIAGNOSIS — H2513 Age-related nuclear cataract, bilateral: Secondary | ICD-10-CM | POA: Diagnosis not present

## 2015-10-04 DIAGNOSIS — H3581 Retinal edema: Secondary | ICD-10-CM | POA: Diagnosis not present

## 2015-10-04 DIAGNOSIS — H43813 Vitreous degeneration, bilateral: Secondary | ICD-10-CM | POA: Diagnosis not present

## 2015-10-04 DIAGNOSIS — H35371 Puckering of macula, right eye: Secondary | ICD-10-CM | POA: Diagnosis not present

## 2015-12-08 DIAGNOSIS — H35371 Puckering of macula, right eye: Secondary | ICD-10-CM | POA: Diagnosis not present

## 2015-12-09 DIAGNOSIS — Z48810 Encounter for surgical aftercare following surgery on the sense organs: Secondary | ICD-10-CM | POA: Diagnosis not present

## 2015-12-09 DIAGNOSIS — H35371 Puckering of macula, right eye: Secondary | ICD-10-CM | POA: Diagnosis not present

## 2015-12-16 DIAGNOSIS — H3581 Retinal edema: Secondary | ICD-10-CM | POA: Diagnosis not present

## 2015-12-16 DIAGNOSIS — Z48810 Encounter for surgical aftercare following surgery on the sense organs: Secondary | ICD-10-CM | POA: Diagnosis not present

## 2015-12-16 DIAGNOSIS — H35371 Puckering of macula, right eye: Secondary | ICD-10-CM | POA: Diagnosis not present

## 2016-01-03 ENCOUNTER — Other Ambulatory Visit: Payer: Self-pay | Admitting: Family Medicine

## 2016-01-06 DIAGNOSIS — H3581 Retinal edema: Secondary | ICD-10-CM | POA: Diagnosis not present

## 2016-01-17 DIAGNOSIS — H35371 Puckering of macula, right eye: Secondary | ICD-10-CM | POA: Diagnosis not present

## 2016-01-17 DIAGNOSIS — H2511 Age-related nuclear cataract, right eye: Secondary | ICD-10-CM | POA: Diagnosis not present

## 2016-01-17 DIAGNOSIS — H2512 Age-related nuclear cataract, left eye: Secondary | ICD-10-CM | POA: Diagnosis not present

## 2016-01-17 DIAGNOSIS — M3500 Sicca syndrome, unspecified: Secondary | ICD-10-CM | POA: Diagnosis not present

## 2016-01-18 DIAGNOSIS — H16123 Filamentary keratitis, bilateral: Secondary | ICD-10-CM | POA: Diagnosis not present

## 2016-01-18 DIAGNOSIS — H35371 Puckering of macula, right eye: Secondary | ICD-10-CM | POA: Diagnosis not present

## 2016-01-18 DIAGNOSIS — H16222 Keratoconjunctivitis sicca, not specified as Sjogren's, left eye: Secondary | ICD-10-CM | POA: Diagnosis not present

## 2016-01-18 DIAGNOSIS — H16221 Keratoconjunctivitis sicca, not specified as Sjogren's, right eye: Secondary | ICD-10-CM | POA: Diagnosis not present

## 2016-01-25 DIAGNOSIS — H16222 Keratoconjunctivitis sicca, not specified as Sjogren's, left eye: Secondary | ICD-10-CM | POA: Diagnosis not present

## 2016-01-25 DIAGNOSIS — H16221 Keratoconjunctivitis sicca, not specified as Sjogren's, right eye: Secondary | ICD-10-CM | POA: Diagnosis not present

## 2016-01-25 DIAGNOSIS — H16123 Filamentary keratitis, bilateral: Secondary | ICD-10-CM | POA: Diagnosis not present

## 2016-01-25 DIAGNOSIS — H35371 Puckering of macula, right eye: Secondary | ICD-10-CM | POA: Diagnosis not present

## 2016-02-06 DIAGNOSIS — H16223 Keratoconjunctivitis sicca, not specified as Sjogren's, bilateral: Secondary | ICD-10-CM | POA: Diagnosis not present

## 2016-02-13 DIAGNOSIS — H16223 Keratoconjunctivitis sicca, not specified as Sjogren's, bilateral: Secondary | ICD-10-CM | POA: Diagnosis not present

## 2016-03-21 DIAGNOSIS — H16123 Filamentary keratitis, bilateral: Secondary | ICD-10-CM | POA: Diagnosis not present

## 2016-03-21 DIAGNOSIS — H16223 Keratoconjunctivitis sicca, not specified as Sjogren's, bilateral: Secondary | ICD-10-CM | POA: Diagnosis not present

## 2016-03-21 DIAGNOSIS — H35371 Puckering of macula, right eye: Secondary | ICD-10-CM | POA: Diagnosis not present

## 2016-03-21 DIAGNOSIS — H04129 Dry eye syndrome of unspecified lacrimal gland: Secondary | ICD-10-CM | POA: Diagnosis not present

## 2016-05-30 DIAGNOSIS — H04129 Dry eye syndrome of unspecified lacrimal gland: Secondary | ICD-10-CM | POA: Diagnosis not present

## 2016-05-30 DIAGNOSIS — H35371 Puckering of macula, right eye: Secondary | ICD-10-CM | POA: Diagnosis not present

## 2016-05-30 DIAGNOSIS — H16223 Keratoconjunctivitis sicca, not specified as Sjogren's, bilateral: Secondary | ICD-10-CM | POA: Diagnosis not present

## 2016-05-30 DIAGNOSIS — H16123 Filamentary keratitis, bilateral: Secondary | ICD-10-CM | POA: Diagnosis not present

## 2016-06-01 DIAGNOSIS — H16223 Keratoconjunctivitis sicca, not specified as Sjogren's, bilateral: Secondary | ICD-10-CM | POA: Diagnosis not present

## 2016-06-05 ENCOUNTER — Encounter: Payer: Self-pay | Admitting: Family Medicine

## 2016-06-05 ENCOUNTER — Ambulatory Visit (INDEPENDENT_AMBULATORY_CARE_PROVIDER_SITE_OTHER): Payer: Medicare Other | Admitting: Family Medicine

## 2016-06-05 VITALS — BP 128/72 | HR 103 | Temp 98.8°F | Ht 65.0 in | Wt 116.4 lb

## 2016-06-05 DIAGNOSIS — R0989 Other specified symptoms and signs involving the circulatory and respiratory systems: Secondary | ICD-10-CM | POA: Diagnosis not present

## 2016-06-05 DIAGNOSIS — J989 Respiratory disorder, unspecified: Secondary | ICD-10-CM

## 2016-06-05 MED ORDER — PREDNISONE 20 MG PO TABS
ORAL_TABLET | ORAL | 1 refills | Status: DC
Start: 2016-06-05 — End: 2017-12-09

## 2016-06-05 MED ORDER — MINOCYCLINE HCL 100 MG PO TABS: 100.0000 mg | ORAL_TABLET | Freq: Two times a day (BID) | ORAL | 1 refills | Status: DC

## 2016-06-05 MED ORDER — HYDROCODONE-HOMATROPINE 5-1.5 MG/5ML PO SYRP: 5.0000 mL | ORAL_SOLUTION | Freq: Three times a day (TID) | ORAL | 0 refills | Status: DC | PRN

## 2016-06-05 NOTE — Progress Notes (Signed)
Erika Jensen is a 70 year old married female nonsmoker who comes in today for evaluation of a cough for one month  She states she went on a family reading into [place] 4 weeks ago and developed a congestion runny nose postnasal drip. 2 weeks ago she developed a cough. She said no fever chills nausea vomiting diarrhea. She has had some slight yellow sputum production. Yesterday she develops some left upper anterior chest wall pain from. She comes in today for evaluation  She has a history of underlying Sjogren's syndrome and she's had a history of reactive airway disease  Well-developed well-nourished female no acute distress vital signs stable she's afebrile pulse ox 96 on room air. HEENT were negative neck was supple no adenopathy lungs are clear no wheezing. Cardiac exam normal  Reactive airway disease..........Marland Kitchen prednisone burst and taper.......... doxycycline 500 mg twice a day for one week for discolored sputum.........Marland Kitchen refill Hydromet.

## 2016-06-05 NOTE — Progress Notes (Signed)
Pre visit review using our clinic review tool, if applicable. No additional management support is needed unless otherwise documented below in the visit note. 

## 2016-06-05 NOTE — Patient Instructions (Signed)
Prednisone 20 mg....... 2 tabs 3 days or until you notice your cough is significantly diminishes then taper as outlined  Hydromet 1/2-1 teaspoon at bedtime when necessary for cough  Doxycycline 100 mg twice a day for 10 days  Return when necessary  Set up your physical examination this fall

## 2016-06-06 ENCOUNTER — Ambulatory Visit: Payer: Medicare Other | Admitting: Family Medicine

## 2016-06-21 ENCOUNTER — Other Ambulatory Visit: Payer: Self-pay

## 2016-06-30 DIAGNOSIS — H16223 Keratoconjunctivitis sicca, not specified as Sjogren's, bilateral: Secondary | ICD-10-CM | POA: Diagnosis not present

## 2016-07-05 ENCOUNTER — Other Ambulatory Visit: Payer: Self-pay | Admitting: Family Medicine

## 2016-07-05 NOTE — Telephone Encounter (Signed)
Rx refill sent to pharmacy. 

## 2016-07-11 DIAGNOSIS — Z23 Encounter for immunization: Secondary | ICD-10-CM | POA: Diagnosis not present

## 2016-08-01 DIAGNOSIS — H35371 Puckering of macula, right eye: Secondary | ICD-10-CM | POA: Diagnosis not present

## 2016-08-01 DIAGNOSIS — H16223 Keratoconjunctivitis sicca, not specified as Sjogren's, bilateral: Secondary | ICD-10-CM | POA: Diagnosis not present

## 2016-08-01 DIAGNOSIS — H16123 Filamentary keratitis, bilateral: Secondary | ICD-10-CM | POA: Diagnosis not present

## 2016-08-01 DIAGNOSIS — H04129 Dry eye syndrome of unspecified lacrimal gland: Secondary | ICD-10-CM | POA: Diagnosis not present

## 2016-08-14 ENCOUNTER — Encounter: Payer: Medicare Other | Admitting: Family Medicine

## 2016-08-15 ENCOUNTER — Encounter: Payer: Medicare Other | Admitting: Family Medicine

## 2016-08-27 ENCOUNTER — Encounter: Payer: Medicare Other | Admitting: Family Medicine

## 2016-09-08 DIAGNOSIS — H16123 Filamentary keratitis, bilateral: Secondary | ICD-10-CM | POA: Diagnosis not present

## 2016-09-08 DIAGNOSIS — H35371 Puckering of macula, right eye: Secondary | ICD-10-CM | POA: Diagnosis not present

## 2016-09-08 DIAGNOSIS — H16223 Keratoconjunctivitis sicca, not specified as Sjogren's, bilateral: Secondary | ICD-10-CM | POA: Diagnosis not present

## 2016-09-08 DIAGNOSIS — H04129 Dry eye syndrome of unspecified lacrimal gland: Secondary | ICD-10-CM | POA: Diagnosis not present

## 2016-10-06 ENCOUNTER — Other Ambulatory Visit: Payer: Self-pay | Admitting: Family Medicine

## 2016-10-10 DIAGNOSIS — H16123 Filamentary keratitis, bilateral: Secondary | ICD-10-CM | POA: Diagnosis not present

## 2016-10-10 DIAGNOSIS — H35371 Puckering of macula, right eye: Secondary | ICD-10-CM | POA: Diagnosis not present

## 2016-10-10 DIAGNOSIS — H04123 Dry eye syndrome of bilateral lacrimal glands: Secondary | ICD-10-CM | POA: Diagnosis not present

## 2016-10-10 DIAGNOSIS — H04122 Dry eye syndrome of left lacrimal gland: Secondary | ICD-10-CM | POA: Diagnosis not present

## 2016-10-10 DIAGNOSIS — H16223 Keratoconjunctivitis sicca, not specified as Sjogren's, bilateral: Secondary | ICD-10-CM | POA: Diagnosis not present

## 2016-10-10 DIAGNOSIS — H04121 Dry eye syndrome of right lacrimal gland: Secondary | ICD-10-CM | POA: Diagnosis not present

## 2016-11-21 DIAGNOSIS — H16223 Keratoconjunctivitis sicca, not specified as Sjogren's, bilateral: Secondary | ICD-10-CM | POA: Diagnosis not present

## 2016-12-04 ENCOUNTER — Ambulatory Visit (INDEPENDENT_AMBULATORY_CARE_PROVIDER_SITE_OTHER): Payer: Medicare HMO | Admitting: Family Medicine

## 2016-12-04 ENCOUNTER — Encounter: Payer: Self-pay | Admitting: Family Medicine

## 2016-12-04 VITALS — BP 120/84 | HR 82 | Temp 97.6°F | Ht 66.0 in | Wt 120.1 lb

## 2016-12-04 DIAGNOSIS — M3500 Sicca syndrome, unspecified: Secondary | ICD-10-CM | POA: Diagnosis not present

## 2016-12-04 DIAGNOSIS — N951 Menopausal and female climacteric states: Secondary | ICD-10-CM

## 2016-12-04 MED ORDER — PANTOPRAZOLE SODIUM 20 MG PO TBEC: 20.0000 mg | DELAYED_RELEASE_TABLET | Freq: Every day | ORAL | 4 refills | Status: DC

## 2016-12-04 MED ORDER — ESTROGENS, CONJUGATED 0.625 MG/GM VA CREA: 1.0000 | TOPICAL_CREAM | Freq: Every day | VAGINAL | 12 refills | Status: DC

## 2016-12-04 NOTE — Patient Instructions (Signed)
Premarin vaginal cream........ one applicator full twice weekly........ [email or web address] or [facility]  Follow-up in one year sooner if any problems

## 2016-12-04 NOTE — Progress Notes (Signed)
Pre visit review using our clinic review tool, if applicable. No additional management support is needed unless otherwise documented below in the visit note. 

## 2016-12-04 NOTE — Progress Notes (Signed)
Fraser Din is a 71 year old married female nonsmoker.......Marland Kitchen retired Optometrist by trade.. Who comes in today for evaluation of Sjogren's syndrome  She's had a long-standing history of Sjogren's syndrome. Her most problematic thing is her dry eyes. She had treatment by Dr. 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 She seen Dr. Joaquim Lai Cypher monthly now for treatment of dry eyes.  She gets routine eye care, dental care, colonoscopy 2011 was normal.  Last pelvic and Pap last year were normal therefore not repeated. GYN and abdominal wise she is asymptomatic except for dry vagina. She is a Premarin cream when necessary.  Vaccinations tetanus booster due October 2018. He's also do a shingles vaccine. We'll give her information on that. She had a flu shot and a drugstore last fall.  Social history... Married lives here in [place] retired Programme researcher, broadcasting/film/video for [employer].  14 point review of systems reviewed and otherwise negative  Cognitive function normal she walks daily home health safety reviewed no issues identified, no guns in the house, she does have a healthcare power of attorney and living well  BP 120/84 (BP Location: Right Arm, Patient Position: Sitting, Cuff Size: Normal)   Pulse 82   Temp 97.6 F (36.4 C) (Oral)   Ht 5\' 6"  (1.676 m)   Wt 120 lb 1.6 oz (54.5 kg)   BMI 19.38 kg/m   Well-developed well-nourished female no acute distress. Examination of HEENT were negative neck was supple thyroid not enlarged. Cardiopulmonary exam normal breast exam normal abdominal exam normal pelvic and rectal deferred extremities normal skin normal peripheral pulses normal  #1 healthy female  #2 Sjogren's syndrome  #3 postmenopausal vaginal dryness  Number for contact dermatitis left hip  ...Marland KitchenMarland KitchenMarland Kitchen

## 2016-12-06 ENCOUNTER — Other Ambulatory Visit (INDEPENDENT_AMBULATORY_CARE_PROVIDER_SITE_OTHER): Payer: Medicare HMO

## 2016-12-06 DIAGNOSIS — Z0000 Encounter for general adult medical examination without abnormal findings: Secondary | ICD-10-CM

## 2016-12-06 LAB — CBC WITH DIFFERENTIAL/PLATELET
Basophils Absolute: 0 10*3/uL (ref 0.0–0.1)
Basophils Relative: 0.8 % (ref 0.0–3.0)
EOS ABS: 0.1 10*3/uL (ref 0.0–0.7)
Eosinophils Relative: 1.4 % (ref 0.0–5.0)
HCT: 39.6 % (ref 36.0–46.0)
Hemoglobin: 13.7 g/dL (ref 12.0–15.0)
LYMPHS ABS: 1.2 10*3/uL (ref 0.7–4.0)
Lymphocytes Relative: 32.6 % (ref 12.0–46.0)
MCHC: 34.6 g/dL (ref 30.0–36.0)
MCV: 91.3 fl (ref 78.0–100.0)
MONO ABS: 0.3 10*3/uL (ref 0.1–1.0)
Monocytes Relative: 8.3 % (ref 3.0–12.0)
NEUTROS ABS: 2.2 10*3/uL (ref 1.4–7.7)
NEUTROS PCT: 56.9 % (ref 43.0–77.0)
PLATELETS: 266 10*3/uL (ref 150.0–400.0)
RBC: 4.34 Mil/uL (ref 3.87–5.11)
RDW: 13.8 % (ref 11.5–15.5)
WBC: 3.8 10*3/uL — ABNORMAL LOW (ref 4.0–10.5)

## 2016-12-06 LAB — LIPID PANEL
Cholesterol: 164 mg/dL (ref 0–200)
HDL: 59.3 mg/dL (ref >39.00)
LDL CALC: 85 mg/dL (ref 0–99)
NONHDL: 104.34
Total CHOL/HDL Ratio: 3
Triglycerides: 97 mg/dL (ref 0.0–149.0)
VLDL: 19.4 mg/dL (ref 0.0–40.0)

## 2016-12-06 LAB — POC URINALSYSI DIPSTICK (AUTOMATED)
BILIRUBIN UA: NEGATIVE
GLUCOSE UA: NEGATIVE
KETONES UA: NEGATIVE
Leukocytes, UA: NEGATIVE
Nitrite, UA: NEGATIVE
Protein, UA: NEGATIVE
Spec Grav, UA: 1.02
Urobilinogen, UA: 0.2
pH, UA: 7

## 2016-12-06 LAB — HEPATIC FUNCTION PANEL
ALBUMIN: 4.2 g/dL (ref 3.5–5.2)
ALK PHOS: 81 U/L (ref 39–117)
ALT: 12 U/L (ref 0–35)
AST: 17 U/L (ref 0–37)
BILIRUBIN DIRECT: 0.1 mg/dL (ref 0.0–0.3)
BILIRUBIN TOTAL: 0.6 mg/dL (ref 0.2–1.2)
Total Protein: 8.2 g/dL (ref 6.0–8.3)

## 2016-12-06 LAB — TSH: TSH: 1.44 u[IU]/mL (ref 0.35–4.50)

## 2016-12-06 LAB — BASIC METABOLIC PANEL
BUN: 12 mg/dL (ref 6–23)
CALCIUM: 9.3 mg/dL (ref 8.4–10.5)
CO2: 28 meq/L (ref 19–32)
CREATININE: 0.67 mg/dL (ref 0.40–1.20)
Chloride: 105 mEq/L (ref 96–112)
GFR: 92.35 mL/min (ref >60.00)
GLUCOSE: 77 mg/dL (ref 70–99)
Potassium: 4.2 mEq/L (ref 3.5–5.1)
Sodium: 137 mEq/L (ref 135–145)

## 2016-12-26 DIAGNOSIS — H16223 Keratoconjunctivitis sicca, not specified as Sjogren's, bilateral: Secondary | ICD-10-CM | POA: Diagnosis not present

## 2017-01-16 ENCOUNTER — Encounter: Payer: Self-pay | Admitting: Family Medicine

## 2017-02-16 ENCOUNTER — Other Ambulatory Visit: Payer: Self-pay | Admitting: Family Medicine

## 2017-02-18 ENCOUNTER — Telehealth: Payer: Self-pay | Admitting: Family Medicine

## 2017-02-18 NOTE — Telephone Encounter (Signed)
Pt has been sch

## 2017-02-18 NOTE — Telephone Encounter (Signed)
Sent to the pharmacy by e-scribe for 90 days.  Message sent to scheduling to help make an appt for cpx.

## 2017-02-18 NOTE — Telephone Encounter (Signed)
Pt due for cpx with Todd.  Please help her to schedule and come fasting.  Thanks!!

## 2017-03-06 DIAGNOSIS — H01009 Unspecified blepharitis unspecified eye, unspecified eyelid: Secondary | ICD-10-CM | POA: Diagnosis not present

## 2017-03-06 DIAGNOSIS — H16223 Keratoconjunctivitis sicca, not specified as Sjogren's, bilateral: Secondary | ICD-10-CM | POA: Diagnosis not present

## 2017-04-15 DIAGNOSIS — H01009 Unspecified blepharitis unspecified eye, unspecified eyelid: Secondary | ICD-10-CM | POA: Diagnosis not present

## 2017-04-15 DIAGNOSIS — H16123 Filamentary keratitis, bilateral: Secondary | ICD-10-CM | POA: Diagnosis not present

## 2017-04-15 DIAGNOSIS — H16223 Keratoconjunctivitis sicca, not specified as Sjogren's, bilateral: Secondary | ICD-10-CM | POA: Diagnosis not present

## 2017-05-04 DIAGNOSIS — H16223 Keratoconjunctivitis sicca, not specified as Sjogren's, bilateral: Secondary | ICD-10-CM | POA: Diagnosis not present

## 2017-05-04 DIAGNOSIS — H01009 Unspecified blepharitis unspecified eye, unspecified eyelid: Secondary | ICD-10-CM | POA: Diagnosis not present

## 2017-05-04 DIAGNOSIS — H16123 Filamentary keratitis, bilateral: Secondary | ICD-10-CM | POA: Diagnosis not present

## 2017-05-25 ENCOUNTER — Other Ambulatory Visit: Payer: Self-pay | Admitting: Family Medicine

## 2017-07-12 ENCOUNTER — Encounter: Payer: Self-pay | Admitting: Family Medicine

## 2017-08-12 DIAGNOSIS — H16223 Keratoconjunctivitis sicca, not specified as Sjogren's, bilateral: Secondary | ICD-10-CM | POA: Diagnosis not present

## 2017-08-12 DIAGNOSIS — H16123 Filamentary keratitis, bilateral: Secondary | ICD-10-CM | POA: Diagnosis not present

## 2017-08-12 DIAGNOSIS — H01009 Unspecified blepharitis unspecified eye, unspecified eyelid: Secondary | ICD-10-CM | POA: Diagnosis not present

## 2017-10-08 DIAGNOSIS — H5213 Myopia, bilateral: Secondary | ICD-10-CM | POA: Diagnosis not present

## 2017-10-28 ENCOUNTER — Ambulatory Visit (INDEPENDENT_AMBULATORY_CARE_PROVIDER_SITE_OTHER): Payer: Medicare HMO | Admitting: Family Medicine

## 2017-10-28 ENCOUNTER — Encounter: Payer: Self-pay | Admitting: Family Medicine

## 2017-10-28 VITALS — BP 110/78 | HR 92 | Temp 98.3°F | Wt 119.0 lb

## 2017-10-28 DIAGNOSIS — J45909 Unspecified asthma, uncomplicated: Secondary | ICD-10-CM | POA: Insufficient documentation

## 2017-10-28 DIAGNOSIS — J4520 Mild intermittent asthma, uncomplicated: Secondary | ICD-10-CM

## 2017-10-28 MED ORDER — DOXYCYCLINE HYCLATE 100 MG PO TABS: 100.0000 mg | ORAL_TABLET | Freq: Two times a day (BID) | ORAL | 0 refills | Status: DC

## 2017-10-28 MED ORDER — BECLOMETHASONE DIPROP HFA 40 MCG/ACT IN AERB: 2.0000 | INHALATION_SPRAY | Freq: Two times a day (BID) | RESPIRATORY_TRACT | 3 refills | Status: DC

## 2017-10-28 NOTE — Progress Notes (Signed)
Erika Jensen is a 72 year old married female nonsmoker retired Engineer, maintenance (IT) who comes in today with a cough  She states her cough starting about 6 weeks ago. She's had reactive airways in the past. She has underlying Sjogren's syndrome. She took a course of prednisone 40 mg a day for 3 days then tapered as outlined. The cough diminished but hasn't gone away. No fever chills. She does have some sputum production the morning but mostly it's dry.  Pulmonary review of systems otherwise negative except for history of reactive airway disease  BP 110/78 (BP Location: Left Arm, Patient Position: Sitting, Cuff Size: Normal)   Pulse 92   Temp 98.3 F (36.8 C) (Oral)   Wt 119 lb (54 kg)   SpO2 98%   BMI 19.21 kg/m  She's a well-developed well-nourished female no acute distress examination of the HEENT were negative neck was supple no adenopathy thyroid normal. Pulmonary exam normal  #1 reactive airway disease........... doxycycline 100 mg twice a day for 10 days......... Qvar 2 puffs twice a day until cough is gone

## 2017-10-28 NOTE — Patient Instructions (Signed)
Doxycycline 100 mg.......... one twice daily for 10 days  Qvar inhaled steroid....... 2 puffs twice a day.......Marland Kitchen remember to switch and spit with mouthwash after each use  Return when necessary

## 2017-11-04 ENCOUNTER — Telehealth: Payer: Self-pay | Admitting: Family Medicine

## 2017-11-04 NOTE — Telephone Encounter (Signed)
Please advise. Thanks.  

## 2017-11-04 NOTE — Telephone Encounter (Signed)
Copied from [place] [id number]. Topic: Quick Communication - See Telephone Encounter >> Nov 04, 2017 10:42 AM Ether Griffins B wrote: CRM for notification. See Telephone encounter for:  Insurance  wont cover the Engineer, agricultural and is needing an alternative called in  11/04/17.

## 2017-11-05 NOTE — Telephone Encounter (Signed)
Spoke with pt stated that she does not need any more  inhaler since she has not been using it.

## 2017-12-09 ENCOUNTER — Ambulatory Visit (INDEPENDENT_AMBULATORY_CARE_PROVIDER_SITE_OTHER): Payer: Medicare HMO | Admitting: Family Medicine

## 2017-12-09 ENCOUNTER — Encounter: Payer: Self-pay | Admitting: Family Medicine

## 2017-12-09 ENCOUNTER — Other Ambulatory Visit: Payer: Self-pay | Admitting: Family Medicine

## 2017-12-09 VITALS — BP 110/80 | HR 90 | Temp 98.2°F | Ht 65.0 in | Wt 119.0 lb

## 2017-12-09 DIAGNOSIS — Z23 Encounter for immunization: Secondary | ICD-10-CM

## 2017-12-09 DIAGNOSIS — K222 Esophageal obstruction: Secondary | ICD-10-CM

## 2017-12-09 DIAGNOSIS — M3500 Sicca syndrome, unspecified: Secondary | ICD-10-CM | POA: Diagnosis not present

## 2017-12-09 DIAGNOSIS — Z0001 Encounter for general adult medical examination with abnormal findings: Secondary | ICD-10-CM

## 2017-12-09 DIAGNOSIS — K219 Gastro-esophageal reflux disease without esophagitis: Secondary | ICD-10-CM | POA: Diagnosis not present

## 2017-12-09 DIAGNOSIS — R32 Unspecified urinary incontinence: Secondary | ICD-10-CM | POA: Diagnosis not present

## 2017-12-09 DIAGNOSIS — Z1231 Encounter for screening mammogram for malignant neoplasm of breast: Secondary | ICD-10-CM

## 2017-12-09 DIAGNOSIS — Z0000 Encounter for general adult medical examination without abnormal findings: Secondary | ICD-10-CM

## 2017-12-09 LAB — CBC WITH DIFFERENTIAL/PLATELET
BASOS ABS: 0 10*3/uL (ref 0.0–0.1)
BASOS PCT: 0.9 % (ref 0.0–3.0)
Eosinophils Absolute: 0.1 10*3/uL (ref 0.0–0.7)
Eosinophils Relative: 1.9 % (ref 0.0–5.0)
HEMATOCRIT: 41.8 % (ref 36.0–46.0)
HEMOGLOBIN: 14.1 g/dL (ref 12.0–15.0)
LYMPHS PCT: 28.5 % (ref 12.0–46.0)
Lymphs Abs: 1 10*3/uL (ref 0.7–4.0)
MCHC: 33.7 g/dL (ref 30.0–36.0)
MCV: 91.2 fl (ref 78.0–100.0)
MONOS PCT: 11.5 % (ref 3.0–12.0)
Monocytes Absolute: 0.4 10*3/uL (ref 0.1–1.0)
NEUTROS ABS: 1.9 10*3/uL (ref 1.4–7.7)
Neutrophils Relative %: 57.2 % (ref 43.0–77.0)
PLATELETS: 323 10*3/uL (ref 150.0–400.0)
RBC: 4.58 Mil/uL (ref 3.87–5.11)
RDW: 13.7 % (ref 11.5–15.5)
WBC: 3.4 10*3/uL — AB (ref 4.0–10.5)

## 2017-12-09 LAB — POCT URINALYSIS DIPSTICK
Bilirubin, UA: NEGATIVE
Blood, UA: NEGATIVE
Glucose, UA: NEGATIVE
KETONES UA: NEGATIVE
Leukocytes, UA: NEGATIVE
Nitrite, UA: NEGATIVE
PH UA: 7 (ref 5.0–8.0)
Protein, UA: NEGATIVE
Spec Grav, UA: 1.015 (ref 1.010–1.025)
Urobilinogen, UA: 0.2 E.U./dL

## 2017-12-09 LAB — LIPID PANEL
CHOL/HDL RATIO: 3
Cholesterol: 155 mg/dL (ref 0–200)
HDL: 50.7 mg/dL (ref >39.00)
LDL CALC: 79 mg/dL (ref 0–99)
NonHDL: 104.22
Triglycerides: 127 mg/dL (ref 0.0–149.0)
VLDL: 25.4 mg/dL (ref 0.0–40.0)

## 2017-12-09 LAB — BASIC METABOLIC PANEL
BUN: 11 mg/dL (ref 6–23)
CHLORIDE: 104 meq/L (ref 96–112)
CO2: 26 mEq/L (ref 19–32)
Calcium: 9.1 mg/dL (ref 8.4–10.5)
Creatinine, Ser: 0.69 mg/dL (ref 0.40–1.20)
GFR: 89.01 mL/min (ref >60.00)
Glucose, Bld: 92 mg/dL (ref 70–99)
POTASSIUM: 4.3 meq/L (ref 3.5–5.1)
SODIUM: 139 meq/L (ref 135–145)

## 2017-12-09 LAB — HEPATIC FUNCTION PANEL
ALT: 11 U/L (ref 0–35)
AST: 18 U/L (ref 0–37)
Albumin: 3.9 g/dL (ref 3.5–5.2)
Alkaline Phosphatase: 84 U/L (ref 39–117)
BILIRUBIN TOTAL: 0.5 mg/dL (ref 0.2–1.2)
Bilirubin, Direct: 0.1 mg/dL (ref 0.0–0.3)
Total Protein: 8 g/dL (ref 6.0–8.3)

## 2017-12-09 LAB — TSH: TSH: 1.45 u[IU]/mL (ref 0.35–4.50)

## 2017-12-09 MED ORDER — ESTROGENS, CONJUGATED 0.625 MG/GM VA CREA: 1.0000 | TOPICAL_CREAM | Freq: Every day | VAGINAL | 12 refills | Status: DC

## 2017-12-09 MED ORDER — PANTOPRAZOLE SODIUM 20 MG PO TBEC: 20.0000 mg | DELAYED_RELEASE_TABLET | Freq: Every day | ORAL | 4 refills | Status: AC

## 2017-12-09 NOTE — Progress Notes (Signed)
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 who comes in today for annual physical examination because of a history of Sjogren's syndrome  She states overall she is doing well. She still has difficulty with dry skin dry eyes and dry mouth. She sees a Pharmacist, community on a regular basis. She is using Restasis eyedrops twice daily  She has difficulty with urinary incontinence episodically. When it occurs use the Premarin cream and then she'll stop. Encouraged use it twice a week every week going forward  She's got a history reflux she takes Protonix 20 mg daily.  She has a history of reactive airway disease when she gets a viral infection. The last time this happened was last fall. We used the inhaled Qvar and short course of doxycycline and her symptoms abated in a short period of time. Encouraged her in the future to use the Qvar immediately when she gets a cold  She gets routine eye care, dental care, colonoscopy and GI,  Vaccinations tetanus booster due today information given on the new shingles vaccine.  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 She and her husband Erika Jensen 3 times weekly. 2 daughters 1 works in [place] the other lives in [place]. She has 2 grandchildren. She's taken the grandkids to [place] next week.  Last Pap smear was 2 years ago. No history of any difficulties therefore Pap every 3 years  BP 110/80 (BP Location: Left Arm, Patient Position: Sitting, Cuff Size: Normal)   Pulse 90   Temp 98.2 F (36.8 C) (Oral)   Ht 5\' 5"  (1.651 m)   Wt 119 lb (54 kg)   BMI 19.80 kg/m  Well-developed well-nourished thin female no acute distress vital signs stable she's afebrile HEENT were negative except for dense right cataract........ due to have that removed this spring.  Neck is supple thyroid is not enlarged no  carotid bruits cardiopulmonary exam normal breast exam was normal abdominal exam was normal pelvic and rectal not indicated therefore not done. Extremities normal skin no peripheral pulses normal  #1 healthy female  #2 Sjogren's syndrome.......... continue treatment program  #3 urinary incontinence........... Premarin cream twice weekly  Reflux esophagitis......... continue proton X 20 mg daily.Marland Kitchen

## 2017-12-09 NOTE — Patient Instructions (Signed)
Labs today,,,,,,,,,, I will Colace is anything abnormal  Call your insurance company and find out where you get the new shingles vaccine  Continue good exercise program  Follow-up in one year sooner if any problems  Call the breast center get set up for your screening mammogram.  Use the Premarin cream small amounts twice weekly,,,

## 2017-12-25 DIAGNOSIS — H16123 Filamentary keratitis, bilateral: Secondary | ICD-10-CM | POA: Diagnosis not present

## 2017-12-25 DIAGNOSIS — H01009 Unspecified blepharitis unspecified eye, unspecified eyelid: Secondary | ICD-10-CM | POA: Diagnosis not present

## 2017-12-25 DIAGNOSIS — H16223 Keratoconjunctivitis sicca, not specified as Sjogren's, bilateral: Secondary | ICD-10-CM | POA: Diagnosis not present

## 2017-12-31 ENCOUNTER — Ambulatory Visit: Payer: Medicare HMO

## 2018-01-01 ENCOUNTER — Ambulatory Visit
Admission: RE | Admit: 2018-01-01 | Discharge: 2018-01-01 | Disposition: A | Discharge status: Home / Self Care | Payer: Medicare HMO | Source: Ambulatory Visit | Attending: Family Medicine | Admitting: Family Medicine

## 2018-01-01 DIAGNOSIS — Z1231 Encounter for screening mammogram for malignant neoplasm of breast: Secondary | ICD-10-CM

## 2018-03-19 DIAGNOSIS — H01009 Unspecified blepharitis unspecified eye, unspecified eyelid: Secondary | ICD-10-CM | POA: Diagnosis not present

## 2018-03-19 DIAGNOSIS — H16123 Filamentary keratitis, bilateral: Secondary | ICD-10-CM | POA: Diagnosis not present

## 2018-03-19 DIAGNOSIS — H04123 Dry eye syndrome of bilateral lacrimal glands: Secondary | ICD-10-CM | POA: Diagnosis not present

## 2018-03-19 DIAGNOSIS — H04121 Dry eye syndrome of right lacrimal gland: Secondary | ICD-10-CM | POA: Diagnosis not present

## 2018-03-19 DIAGNOSIS — H04122 Dry eye syndrome of left lacrimal gland: Secondary | ICD-10-CM | POA: Diagnosis not present

## 2018-05-26 DIAGNOSIS — H04123 Dry eye syndrome of bilateral lacrimal glands: Secondary | ICD-10-CM | POA: Diagnosis not present

## 2018-06-24 DIAGNOSIS — H2511 Age-related nuclear cataract, right eye: Secondary | ICD-10-CM | POA: Diagnosis not present

## 2018-06-24 DIAGNOSIS — H25811 Combined forms of age-related cataract, right eye: Secondary | ICD-10-CM | POA: Diagnosis not present

## 2018-06-24 DIAGNOSIS — Z961 Presence of intraocular lens: Secondary | ICD-10-CM | POA: Diagnosis not present

## 2018-06-25 DIAGNOSIS — H2512 Age-related nuclear cataract, left eye: Secondary | ICD-10-CM | POA: Diagnosis not present

## 2018-07-02 DIAGNOSIS — R69 Illness, unspecified: Secondary | ICD-10-CM | POA: Diagnosis not present

## 2018-08-19 DIAGNOSIS — Z961 Presence of intraocular lens: Secondary | ICD-10-CM | POA: Diagnosis not present

## 2018-08-19 DIAGNOSIS — H2511 Age-related nuclear cataract, right eye: Secondary | ICD-10-CM | POA: Diagnosis not present

## 2018-08-19 DIAGNOSIS — H2512 Age-related nuclear cataract, left eye: Secondary | ICD-10-CM | POA: Diagnosis not present

## 2018-08-19 DIAGNOSIS — H25812 Combined forms of age-related cataract, left eye: Secondary | ICD-10-CM | POA: Diagnosis not present

## 2018-09-30 DIAGNOSIS — R69 Illness, unspecified: Secondary | ICD-10-CM | POA: Diagnosis not present

## 2018-11-28 DIAGNOSIS — Z136 Encounter for screening for cardiovascular disorders: Secondary | ICD-10-CM | POA: Diagnosis not present

## 2018-11-28 DIAGNOSIS — Z5181 Encounter for therapeutic drug level monitoring: Secondary | ICD-10-CM | POA: Diagnosis not present

## 2018-11-28 DIAGNOSIS — Z0000 Encounter for general adult medical examination without abnormal findings: Secondary | ICD-10-CM | POA: Diagnosis not present

## 2018-12-05 ENCOUNTER — Other Ambulatory Visit: Payer: Self-pay | Admitting: Family Medicine

## 2018-12-05 DIAGNOSIS — Z1231 Encounter for screening mammogram for malignant neoplasm of breast: Secondary | ICD-10-CM

## 2019-01-05 ENCOUNTER — Other Ambulatory Visit: Payer: Self-pay

## 2019-01-05 ENCOUNTER — Ambulatory Visit
Admission: RE | Admit: 2019-01-05 | Discharge: 2019-01-05 | Disposition: A | Discharge status: Home / Self Care | Payer: Medicare Other | Source: Ambulatory Visit | Attending: Family Medicine | Admitting: Family Medicine

## 2019-01-05 DIAGNOSIS — Z1231 Encounter for screening mammogram for malignant neoplasm of breast: Secondary | ICD-10-CM | POA: Diagnosis not present

## 2019-04-15 DIAGNOSIS — H35371 Puckering of macula, right eye: Secondary | ICD-10-CM | POA: Diagnosis not present

## 2019-04-15 DIAGNOSIS — H16223 Keratoconjunctivitis sicca, not specified as Sjogren's, bilateral: Secondary | ICD-10-CM | POA: Diagnosis not present

## 2019-05-28 IMAGING — MG DIGITAL SCREENING BILATERAL MAMMOGRAM WITH TOMO AND CAD
8 series · 9 of 24 positions shown · non-contrast
Comparison: Previous exam(s).

CLINICAL DATA: Screening.

EXAM:
DIGITAL SCREENING BILATERAL MAMMOGRAM WITH TOMO AND CAD

[R CC synth-2D]
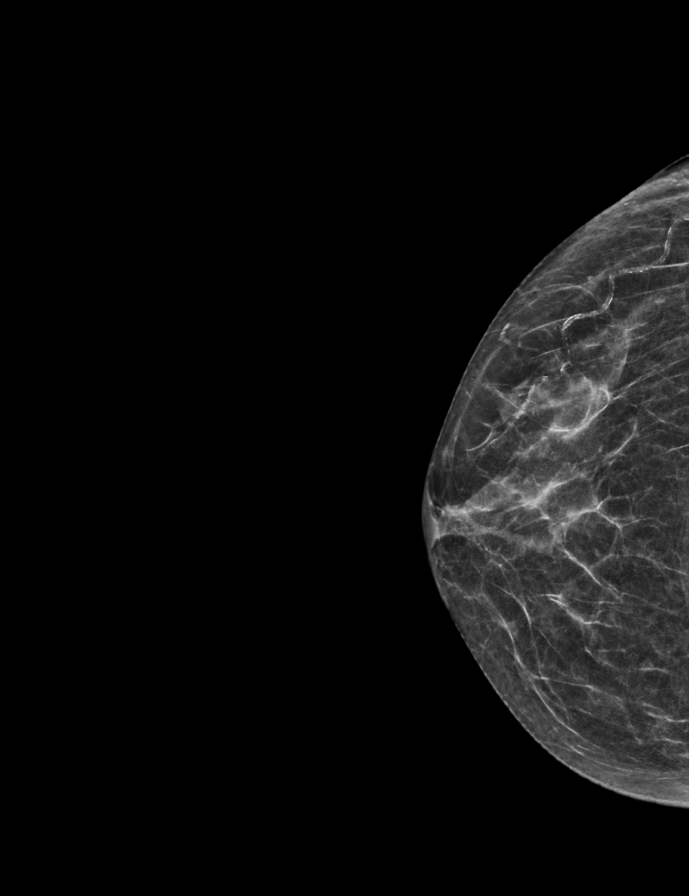

[L CC synth-2D]
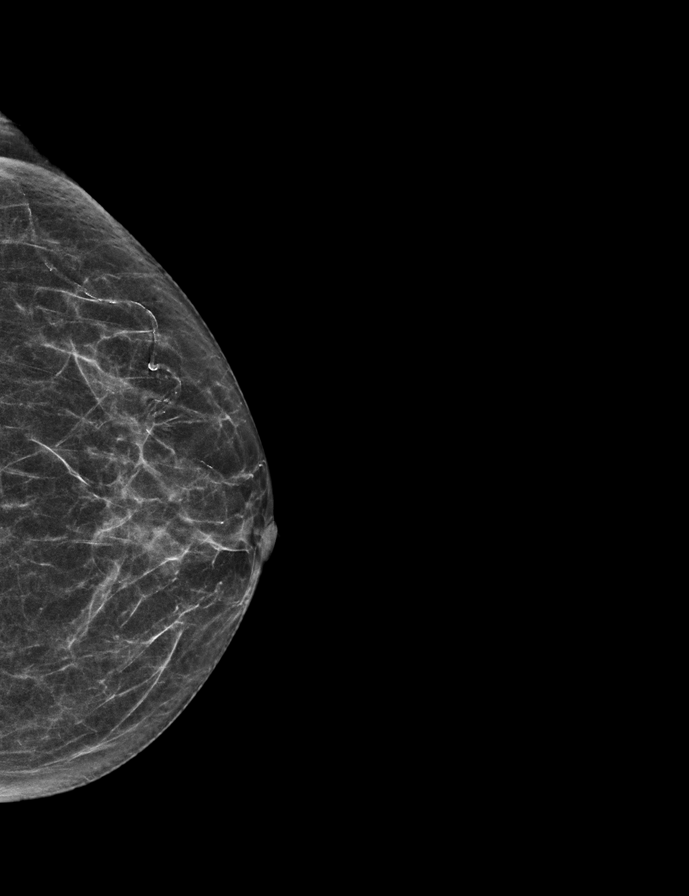

[L MLO synth-2D]
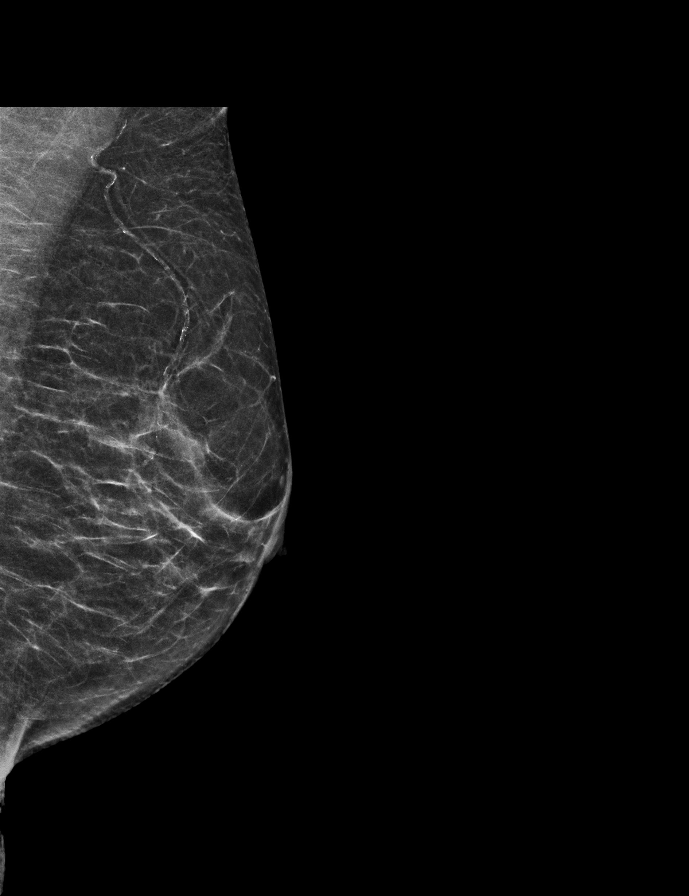

[R MLO synth-2D]
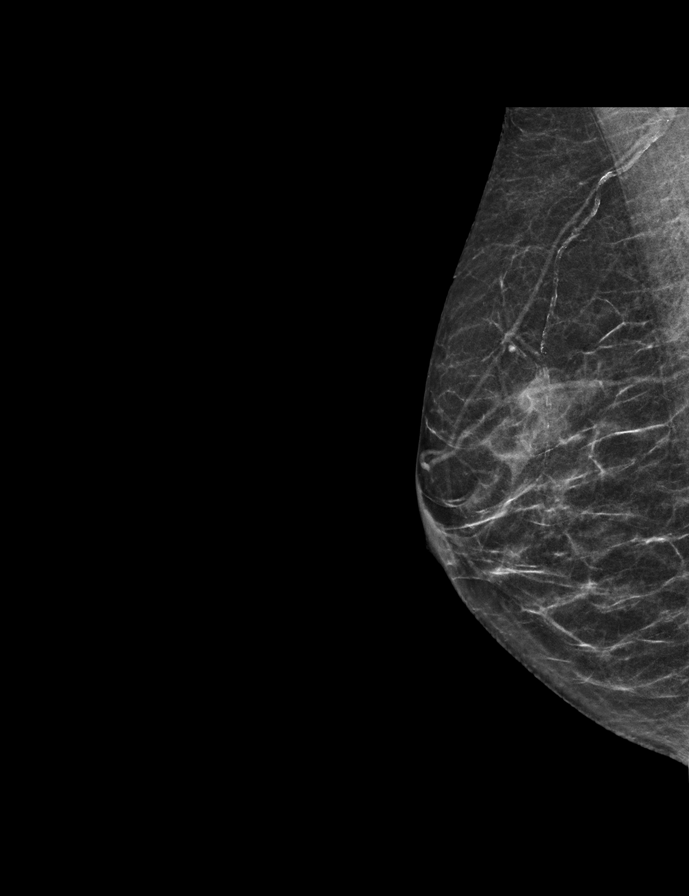

[L CC tomo · 2 of 52 frames shown]
[frame 17/52]
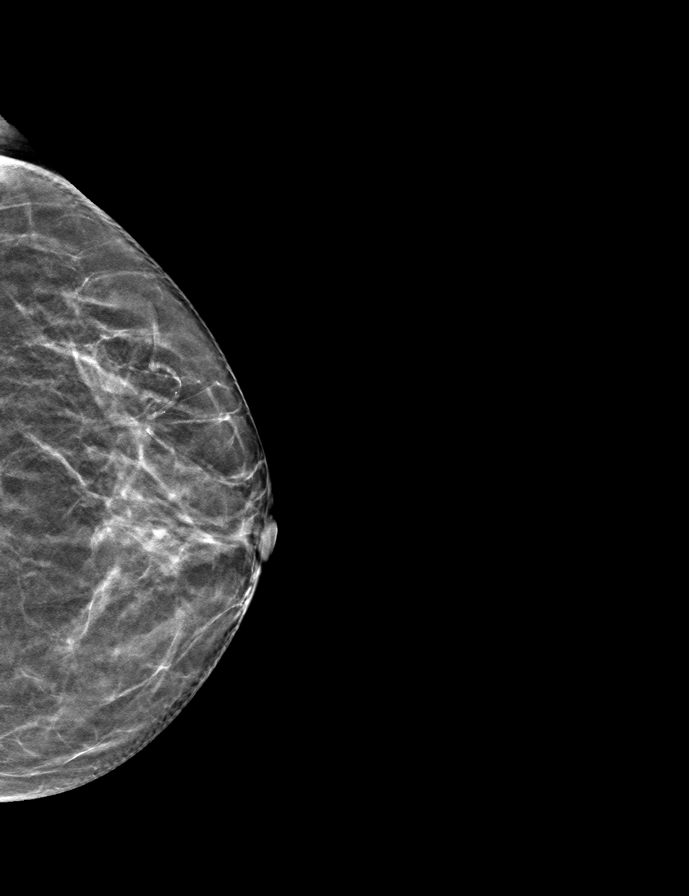
[frame 27/52]
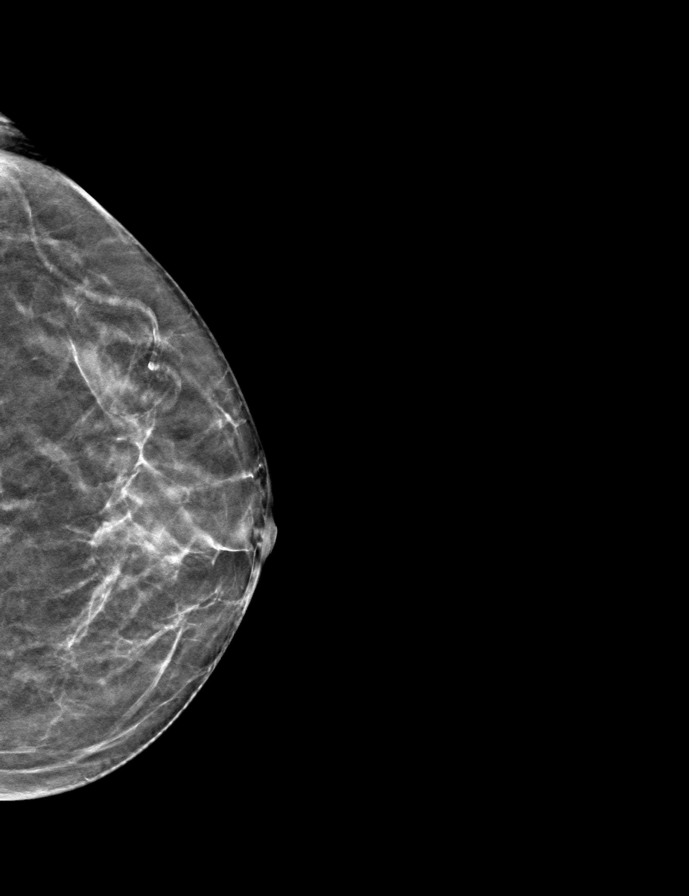

[R MLO tomo · tomo slice 25/48.0]
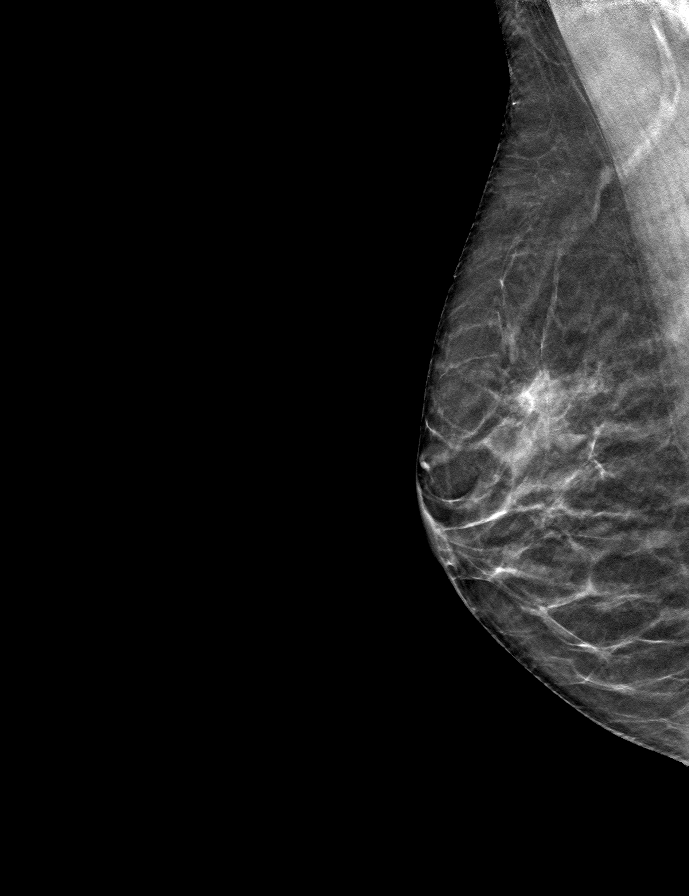

[R CC tomo · tomo slice 24/47.0]
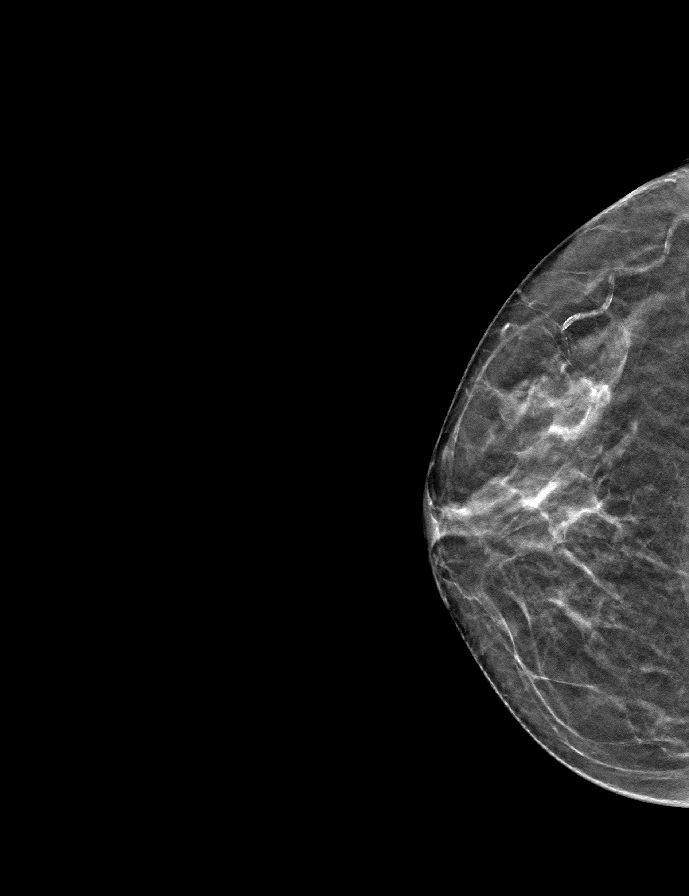

[L MLO tomo · tomo slice 27/52.0]
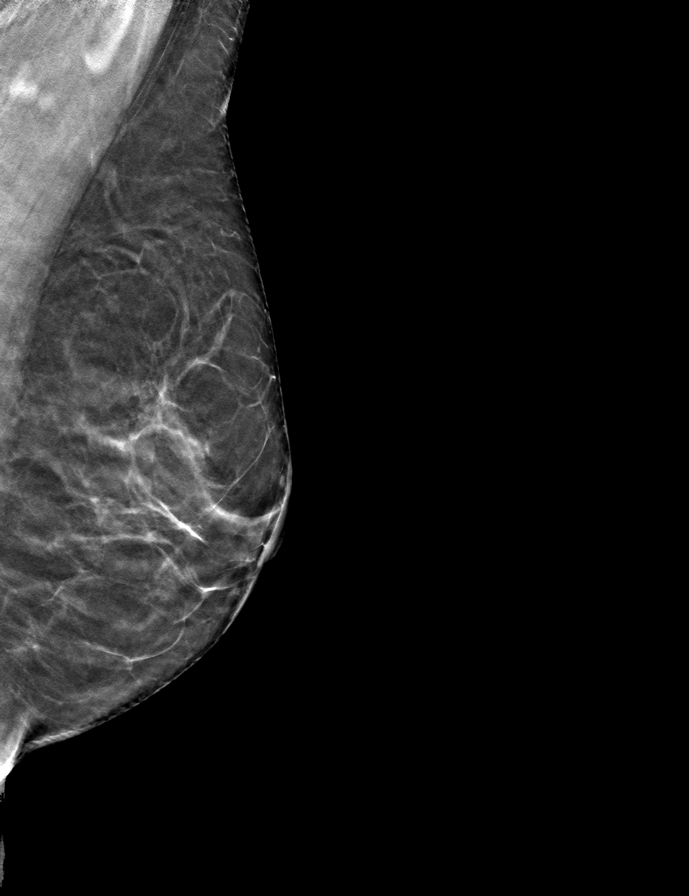

[9 of 24 positions shown; findings below may reference images not displayed]

ACR Breast Density Category b: There are scattered areas of
fibroglandular density.
FINDINGS: There are no findings suspicious for malignancy. Images were
processed with CAD.
IMPRESSION: No mammographic evidence of malignancy. A result letter of this
screening mammogram will be mailed directly to the patient.

RECOMMENDATION:
Screening mammogram in one year. (Code:[id number])

BI-RADS CATEGORY  1: Negative.

## 2019-06-22 DIAGNOSIS — L570 Actinic keratosis: Secondary | ICD-10-CM | POA: Diagnosis not present

## 2019-06-22 DIAGNOSIS — L309 Dermatitis, unspecified: Secondary | ICD-10-CM | POA: Diagnosis not present

## 2019-06-22 DIAGNOSIS — D485 Neoplasm of uncertain behavior of skin: Secondary | ICD-10-CM | POA: Diagnosis not present

## 2019-11-05 DIAGNOSIS — Z03818 Encounter for observation for suspected exposure to other biological agents ruled out: Secondary | ICD-10-CM | POA: Diagnosis not present

## 2019-11-05 DIAGNOSIS — Z20822 Contact with and (suspected) exposure to covid-19: Secondary | ICD-10-CM | POA: Diagnosis not present

## 2019-11-28 ENCOUNTER — Ambulatory Visit: Payer: Medicare HMO | Attending: Internal Medicine

## 2019-11-28 DIAGNOSIS — Z23 Encounter for immunization: Secondary | ICD-10-CM | POA: Insufficient documentation

## 2019-11-28 NOTE — Progress Notes (Signed)
   Covid-19 Vaccination Clinic  Name:  Charlotterose Nissen    MRN: [id number] DOB: June 30, 1946  11/28/2019  Ms. Elsea was observed post Covid-19 immunization for 15 minutes without incidence. She was provided with Vaccine Information Sheet and instruction to access the V-Safe system.   Ms. Augustin was instructed to call 911 with any severe reactions post vaccine: Marland Kitchen Difficulty breathing  . Swelling of your face and throat  . A fast heartbeat  . A bad rash all over your body  . Dizziness and weakness    Immunizations Administered    Name Date Dose VIS Date Route   Pfizer COVID-19 Vaccine 11/28/2019  4:36 PM 0.3 mL 10/02/2019 Intramuscular   Manufacturer: [place]   Lot: [id number]   [place]: [id number]

## 2019-12-16 ENCOUNTER — Ambulatory Visit: Payer: Medicare Other

## 2019-12-23 ENCOUNTER — Ambulatory Visit: Payer: Medicare HMO | Attending: Internal Medicine

## 2019-12-23 DIAGNOSIS — Z23 Encounter for immunization: Secondary | ICD-10-CM | POA: Insufficient documentation

## 2019-12-23 NOTE — Progress Notes (Signed)
   Covid-19 Vaccination Clinic  Name:  Erika Jensen    MRN: [id number] DOB: September 22, 1946  12/23/2019  Erika Jensen was observed post Covid-19 immunization for 15 minutes without incident. She was provided with Vaccine Information Sheet and instruction to access the V-Safe system.   Erika Jensen was instructed to call 911 with any severe reactions post vaccine: Marland Kitchen Difficulty breathing  . Swelling of face and throat  . A fast heartbeat  . A bad rash all over body  . Dizziness and weakness   Immunizations Administered    Name Date Dose VIS Date Route   Pfizer COVID-19 Vaccine 12/23/2019  8:46 AM 0.3 mL 10/02/2019 Intramuscular   Manufacturer: [place]   Lot: [id number]   [place]: [id number]

## 2020-01-05 DIAGNOSIS — H5203 Hypermetropia, bilateral: Secondary | ICD-10-CM | POA: Diagnosis not present

## 2020-01-19 ENCOUNTER — Ambulatory Visit: Payer: Medicare HMO | Admitting: Sports Medicine

## 2020-01-19 ENCOUNTER — Other Ambulatory Visit: Payer: Self-pay

## 2020-01-19 DIAGNOSIS — M7552 Bursitis of left shoulder: Secondary | ICD-10-CM | POA: Diagnosis not present

## 2020-01-19 DIAGNOSIS — M7550 Bursitis of unspecified shoulder: Secondary | ICD-10-CM | POA: Insufficient documentation

## 2020-01-19 NOTE — Progress Notes (Addendum)
    SUBJECTIVE:   CHIEF COMPLAINT: L shoulder pain  HPI:   Patient is a right-handed female who presents for 1 week history of left shoulder pain after trimming her trees with a large clipper.  Pain is located along her lateral left shoulder.  Denies any difficulties moving her neck.  Denies any numbness or tingling. She took Advil for pain.  The next few days she went hiking with gradual improvement in her pain.  Does report sleeping on her left side can make her pain worse pain is described as a dull ache.  She has been doing some shoulder exercises but is unsure if these are helpful.  She wants to know what exercises she can do to decrease pain.  She is 10 days post injury and today is 60 to 70% improved.  PERTINENT  PMH / PSH: RAD, Sjogren's syndrome  OBJECTIVE:   BP 136/74   Ht 5' 5.5" (1.664 m)   Wt 118 lb (53.5 kg)   BMI 19.34 kg/m   GEN: Well-appearing, in NAD MSK: Shoulder: Inspection: No obvious deformity or asymmetry, no overlying skin changes Palpation: Nontender to palpation over AC joint or deltoid ROM: Full AROM with abduction, forward flexion, internal and external rotation Strength: 5/5 strength with resisted abduction, adduction, forward flexion Stability: No appreciable joint laxity Special Tests:  -Negative empty can -Negative [place] -Negative speeds Neurovascular: Intact  Limited ultrasound of the left shoulder Intact attachment of rotator cuff tendons without appreciable tears or edema.   Slight edema noted in subdeltoid bursa but without restriction of movement.   No other bony, muscular, or tenderness abnormalities noted.  Impression: resolving subdeltoid bursitis  Ultrasound and interpretation by Dr. Ky Barban and Wolfgang Phoenix. Fields, MD    ASSESSMENT/PLAN:   Shoulder bursitis Subdeltoid bursitis appreciated on ultrasound and likely cause of pain from overuse injury with pruning trees.  No findings on ultrasound or exam to suggest rotator cuff tear,  impingement or radiculopathy.  Will provide spokes of the wheel exercises to help decrease pain and inflammation.  Continue as needed NSAIDs.  Follow-up as needed.   Rory Percy, DO PGY-3, [facility] 01/19/2020 10:00 AM   I observed and examined the patient with the resident and agree with assessment and plan.  Note reviewed and modified by me. Ila Mcgill, MD

## 2020-01-19 NOTE — Assessment & Plan Note (Signed)
Subdeltoid bursitis appreciated on ultrasound and likely cause of pain from overuse injury with pruning trees.  No findings on ultrasound or exam to suggest rotator cuff tear, impingement or radiculopathy.  Will provide spokes of the wheel exercises to help decrease pain and inflammation.  Continue as needed NSAIDs.  Follow-up as needed.

## 2020-01-19 NOTE — Patient Instructions (Addendum)
It was great to see you!  Our plans for today:  - Do the shoulder exercises daily as prescribed. - Continue to take ibuprofen or tylenol as needed.  - Follow up as needed.

## 2020-05-04 DIAGNOSIS — R69 Illness, unspecified: Secondary | ICD-10-CM | POA: Diagnosis not present

## 2020-05-20 DIAGNOSIS — T63461A Toxic effect of venom of wasps, accidental (unintentional), initial encounter: Secondary | ICD-10-CM | POA: Diagnosis not present

## 2020-05-26 DIAGNOSIS — Z20822 Contact with and (suspected) exposure to covid-19: Secondary | ICD-10-CM | POA: Diagnosis not present

## 2020-05-30 DIAGNOSIS — T63461A Toxic effect of venom of wasps, accidental (unintentional), initial encounter: Secondary | ICD-10-CM | POA: Diagnosis not present

## 2020-07-12 DIAGNOSIS — R69 Illness, unspecified: Secondary | ICD-10-CM | POA: Diagnosis not present

## 2020-07-18 DIAGNOSIS — R69 Illness, unspecified: Secondary | ICD-10-CM | POA: Diagnosis not present

## 2020-07-19 DIAGNOSIS — R69 Illness, unspecified: Secondary | ICD-10-CM | POA: Diagnosis not present

## 2020-08-04 DIAGNOSIS — R69 Illness, unspecified: Secondary | ICD-10-CM | POA: Diagnosis not present

## 2020-08-06 DIAGNOSIS — Z23 Encounter for immunization: Secondary | ICD-10-CM | POA: Diagnosis not present

## 2020-08-09 DIAGNOSIS — R69 Illness, unspecified: Secondary | ICD-10-CM | POA: Diagnosis not present

## 2020-08-17 DIAGNOSIS — E559 Vitamin D deficiency, unspecified: Secondary | ICD-10-CM | POA: Diagnosis not present

## 2020-08-17 DIAGNOSIS — Z5181 Encounter for therapeutic drug level monitoring: Secondary | ICD-10-CM | POA: Diagnosis not present

## 2020-08-17 DIAGNOSIS — Z136 Encounter for screening for cardiovascular disorders: Secondary | ICD-10-CM | POA: Diagnosis not present

## 2020-08-23 DIAGNOSIS — Z20822 Contact with and (suspected) exposure to covid-19: Secondary | ICD-10-CM | POA: Diagnosis not present

## 2020-10-03 DIAGNOSIS — Z961 Presence of intraocular lens: Secondary | ICD-10-CM | POA: Diagnosis not present

## 2020-10-03 DIAGNOSIS — H04123 Dry eye syndrome of bilateral lacrimal glands: Secondary | ICD-10-CM | POA: Diagnosis not present

## 2020-10-03 DIAGNOSIS — H35371 Puckering of macula, right eye: Secondary | ICD-10-CM | POA: Diagnosis not present

## 2020-10-03 DIAGNOSIS — H16123 Filamentary keratitis, bilateral: Secondary | ICD-10-CM | POA: Diagnosis not present

## 2020-10-12 DIAGNOSIS — R69 Illness, unspecified: Secondary | ICD-10-CM | POA: Diagnosis not present

## 2020-10-12 DIAGNOSIS — Z0000 Encounter for general adult medical examination without abnormal findings: Secondary | ICD-10-CM | POA: Diagnosis not present

## 2020-10-12 DIAGNOSIS — Z1211 Encounter for screening for malignant neoplasm of colon: Secondary | ICD-10-CM | POA: Diagnosis not present

## 2020-10-13 DIAGNOSIS — Z1211 Encounter for screening for malignant neoplasm of colon: Secondary | ICD-10-CM | POA: Diagnosis not present

## 2020-10-18 ENCOUNTER — Other Ambulatory Visit: Payer: Self-pay | Admitting: Family Medicine

## 2020-10-18 DIAGNOSIS — E2839 Other primary ovarian failure: Secondary | ICD-10-CM

## 2021-02-22 ENCOUNTER — Ambulatory Visit
Admission: RE | Admit: 2021-02-22 | Discharge: 2021-02-22 | Disposition: A | Discharge status: Home / Self Care | Payer: Medicare HMO | Source: Ambulatory Visit | Attending: Family Medicine | Admitting: Family Medicine

## 2021-02-22 ENCOUNTER — Ambulatory Visit: Payer: Medicare HMO | Admitting: Family Medicine

## 2021-02-22 ENCOUNTER — Other Ambulatory Visit: Payer: Self-pay

## 2021-02-22 VITALS — BP 122/78 | Ht 65.5 in | Wt 118.0 lb

## 2021-02-22 DIAGNOSIS — M545 Low back pain, unspecified: Secondary | ICD-10-CM

## 2021-02-22 NOTE — Patient Instructions (Signed)
Get x-rays after you leave today to assess for a compression fracture of your lumbar spine. Advil as needed for pain and inflammation. Ok to take tylenol 500mg  1-2 tabs up to three times a day as needed also. Activities as tolerated - use pain as a guide. We will call you with the results and when to follow up based on those.

## 2021-02-23 ENCOUNTER — Encounter: Payer: Self-pay | Admitting: Family Medicine
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
## 2021-03-21 NOTE — Telephone Encounter (Signed)
Please go ahead with PT referral for Erika Jensen - see her message - lives in [facility] - and let her know.  Thanks!

## 2021-03-23 DIAGNOSIS — M2569 Stiffness of other specified joint, not elsewhere classified: Secondary | ICD-10-CM | POA: Diagnosis not present

## 2021-03-23 DIAGNOSIS — R293 Abnormal posture: Secondary | ICD-10-CM | POA: Diagnosis not present

## 2021-03-23 DIAGNOSIS — M545 Low back pain, unspecified: Secondary | ICD-10-CM | POA: Diagnosis not present

## 2021-03-23 DIAGNOSIS — M6281 Muscle weakness (generalized): Secondary | ICD-10-CM | POA: Diagnosis not present

## 2021-03-29 DIAGNOSIS — R293 Abnormal posture: Secondary | ICD-10-CM | POA: Diagnosis not present

## 2021-03-29 DIAGNOSIS — M6281 Muscle weakness (generalized): Secondary | ICD-10-CM | POA: Diagnosis not present

## 2021-03-29 DIAGNOSIS — M2569 Stiffness of other specified joint, not elsewhere classified: Secondary | ICD-10-CM | POA: Diagnosis not present

## 2021-03-29 DIAGNOSIS — M545 Low back pain, unspecified: Secondary | ICD-10-CM | POA: Diagnosis not present

## 2021-03-31 DIAGNOSIS — M545 Low back pain, unspecified: Secondary | ICD-10-CM | POA: Diagnosis not present

## 2021-03-31 DIAGNOSIS — M2569 Stiffness of other specified joint, not elsewhere classified: Secondary | ICD-10-CM | POA: Diagnosis not present

## 2021-03-31 DIAGNOSIS — M6281 Muscle weakness (generalized): Secondary | ICD-10-CM | POA: Diagnosis not present

## 2021-03-31 DIAGNOSIS — R293 Abnormal posture: Secondary | ICD-10-CM | POA: Diagnosis not present

## 2021-04-05 DIAGNOSIS — M545 Low back pain, unspecified: Secondary | ICD-10-CM | POA: Diagnosis not present

## 2021-04-05 DIAGNOSIS — R293 Abnormal posture: Secondary | ICD-10-CM | POA: Diagnosis not present

## 2021-04-05 DIAGNOSIS — M2569 Stiffness of other specified joint, not elsewhere classified: Secondary | ICD-10-CM | POA: Diagnosis not present

## 2021-04-05 DIAGNOSIS — M6281 Muscle weakness (generalized): Secondary | ICD-10-CM | POA: Diagnosis not present

## 2021-04-07 DIAGNOSIS — M6281 Muscle weakness (generalized): Secondary | ICD-10-CM | POA: Diagnosis not present

## 2021-04-07 DIAGNOSIS — M545 Low back pain, unspecified: Secondary | ICD-10-CM | POA: Diagnosis not present

## 2021-04-07 DIAGNOSIS — R293 Abnormal posture: Secondary | ICD-10-CM | POA: Diagnosis not present

## 2021-04-07 DIAGNOSIS — M2569 Stiffness of other specified joint, not elsewhere classified: Secondary | ICD-10-CM | POA: Diagnosis not present

## 2021-04-12 DIAGNOSIS — M2569 Stiffness of other specified joint, not elsewhere classified: Secondary | ICD-10-CM | POA: Diagnosis not present

## 2021-04-12 DIAGNOSIS — M6281 Muscle weakness (generalized): Secondary | ICD-10-CM | POA: Diagnosis not present

## 2021-04-12 DIAGNOSIS — M545 Low back pain, unspecified: Secondary | ICD-10-CM | POA: Diagnosis not present

## 2021-04-12 DIAGNOSIS — R293 Abnormal posture: Secondary | ICD-10-CM | POA: Diagnosis not present

## 2021-04-13 ENCOUNTER — Ambulatory Visit: Payer: Medicare HMO | Admitting: Family Medicine

## 2021-04-13 ENCOUNTER — Encounter: Payer: Self-pay | Admitting: Family Medicine

## 2021-04-13 ENCOUNTER — Other Ambulatory Visit: Payer: Self-pay

## 2021-04-13 ENCOUNTER — Ambulatory Visit
Admission: RE | Admit: 2021-04-13 | Discharge: 2021-04-13 | Disposition: A | Discharge status: Home / Self Care | Payer: Medicare HMO | Source: Ambulatory Visit | Attending: Family Medicine | Admitting: Family Medicine

## 2021-04-13 VITALS — BP 114/64 | Ht 65.5 in | Wt 114.0 lb

## 2021-04-13 DIAGNOSIS — M545 Low back pain, unspecified: Secondary | ICD-10-CM | POA: Diagnosis not present

## 2021-04-13 DIAGNOSIS — R293 Abnormal posture: Secondary | ICD-10-CM | POA: Diagnosis not present

## 2021-04-13 DIAGNOSIS — M6281 Muscle weakness (generalized): Secondary | ICD-10-CM | POA: Diagnosis not present

## 2021-04-13 DIAGNOSIS — M2569 Stiffness of other specified joint, not elsewhere classified: Secondary | ICD-10-CM | POA: Diagnosis not present

## 2021-04-13 NOTE — Patient Instructions (Signed)
Get repeat x-rays of your back after you leave today. If these look good, continue with the physical therapy, home exercises, and consult with the chiropractor. Topical voltaren gel up to 4 times a day and/or lidocaine (salon pas) patches topically can help with pain. Ok to take tylenol 500mg  1-2 tabs up to three times a day as needed also. Activities as tolerated - use pain as a guide.
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
## 2021-04-20 ENCOUNTER — Other Ambulatory Visit (HOSPITAL_BASED_OUTPATIENT_CLINIC_OR_DEPARTMENT_OTHER): Payer: Self-pay | Admitting: Family Medicine

## 2021-04-20 DIAGNOSIS — E2839 Other primary ovarian failure: Secondary | ICD-10-CM

## 2021-04-25 DIAGNOSIS — M2569 Stiffness of other specified joint, not elsewhere classified: Secondary | ICD-10-CM | POA: Diagnosis not present

## 2021-04-25 DIAGNOSIS — M6281 Muscle weakness (generalized): Secondary | ICD-10-CM | POA: Diagnosis not present

## 2021-04-25 DIAGNOSIS — M545 Low back pain, unspecified: Secondary | ICD-10-CM | POA: Diagnosis not present

## 2021-04-25 DIAGNOSIS — R293 Abnormal posture: Secondary | ICD-10-CM | POA: Diagnosis not present

## 2021-04-27 DIAGNOSIS — M6281 Muscle weakness (generalized): Secondary | ICD-10-CM | POA: Diagnosis not present

## 2021-04-27 DIAGNOSIS — M2569 Stiffness of other specified joint, not elsewhere classified: Secondary | ICD-10-CM | POA: Diagnosis not present

## 2021-04-27 DIAGNOSIS — R293 Abnormal posture: Secondary | ICD-10-CM | POA: Diagnosis not present

## 2021-04-27 DIAGNOSIS — M545 Low back pain, unspecified: Secondary | ICD-10-CM | POA: Diagnosis not present

## 2021-05-01 ENCOUNTER — Other Ambulatory Visit: Payer: Self-pay

## 2021-05-01 ENCOUNTER — Ambulatory Visit (HOSPITAL_BASED_OUTPATIENT_CLINIC_OR_DEPARTMENT_OTHER)
Admission: RE | Admit: 2021-05-01 | Discharge: 2021-05-01 | Disposition: A | Discharge status: Home / Self Care | Payer: Medicare HMO | Source: Ambulatory Visit | Attending: Family Medicine | Admitting: Family Medicine

## 2021-05-01 DIAGNOSIS — E2839 Other primary ovarian failure: Secondary | ICD-10-CM | POA: Insufficient documentation

## 2021-05-01 DIAGNOSIS — M6281 Muscle weakness (generalized): Secondary | ICD-10-CM | POA: Diagnosis not present

## 2021-05-01 DIAGNOSIS — M2569 Stiffness of other specified joint, not elsewhere classified: Secondary | ICD-10-CM | POA: Diagnosis not present

## 2021-05-01 DIAGNOSIS — R293 Abnormal posture: Secondary | ICD-10-CM | POA: Diagnosis not present

## 2021-05-01 DIAGNOSIS — M545 Low back pain, unspecified: Secondary | ICD-10-CM | POA: Diagnosis not present

## 2021-05-01 DIAGNOSIS — Z780 Asymptomatic menopausal state: Secondary | ICD-10-CM | POA: Diagnosis not present

## 2021-05-01 DIAGNOSIS — M810 Age-related osteoporosis without current pathological fracture: Secondary | ICD-10-CM | POA: Diagnosis not present

## 2021-05-03 DIAGNOSIS — M2569 Stiffness of other specified joint, not elsewhere classified: Secondary | ICD-10-CM | POA: Diagnosis not present

## 2021-05-03 DIAGNOSIS — R293 Abnormal posture: Secondary | ICD-10-CM | POA: Diagnosis not present

## 2021-05-03 DIAGNOSIS — M6281 Muscle weakness (generalized): Secondary | ICD-10-CM | POA: Diagnosis not present

## 2021-05-03 DIAGNOSIS — M545 Low back pain, unspecified: Secondary | ICD-10-CM | POA: Diagnosis not present

## 2021-05-22 DIAGNOSIS — M6281 Muscle weakness (generalized): Secondary | ICD-10-CM | POA: Diagnosis not present

## 2021-05-22 DIAGNOSIS — R293 Abnormal posture: Secondary | ICD-10-CM | POA: Diagnosis not present

## 2021-05-22 DIAGNOSIS — M545 Low back pain, unspecified: Secondary | ICD-10-CM | POA: Diagnosis not present

## 2021-05-22 DIAGNOSIS — M2569 Stiffness of other specified joint, not elsewhere classified: Secondary | ICD-10-CM | POA: Diagnosis not present

## 2021-05-26 DIAGNOSIS — M2569 Stiffness of other specified joint, not elsewhere classified: Secondary | ICD-10-CM | POA: Diagnosis not present

## 2021-05-26 DIAGNOSIS — R293 Abnormal posture: Secondary | ICD-10-CM | POA: Diagnosis not present

## 2021-05-26 DIAGNOSIS — M6281 Muscle weakness (generalized): Secondary | ICD-10-CM | POA: Diagnosis not present

## 2021-05-26 DIAGNOSIS — M545 Low back pain, unspecified: Secondary | ICD-10-CM | POA: Diagnosis not present

## 2021-05-26 DIAGNOSIS — Z0100 Encounter for examination of eyes and vision without abnormal findings: Secondary | ICD-10-CM | POA: Diagnosis not present

## 2021-05-29 ENCOUNTER — Other Ambulatory Visit: Payer: Medicare HMO

## 2021-05-29 DIAGNOSIS — M6281 Muscle weakness (generalized): Secondary | ICD-10-CM | POA: Diagnosis not present

## 2021-05-29 DIAGNOSIS — M545 Low back pain, unspecified: Secondary | ICD-10-CM | POA: Diagnosis not present

## 2021-05-29 DIAGNOSIS — R293 Abnormal posture: Secondary | ICD-10-CM | POA: Diagnosis not present

## 2021-05-29 DIAGNOSIS — M2569 Stiffness of other specified joint, not elsewhere classified: Secondary | ICD-10-CM | POA: Diagnosis not present

## 2021-06-02 DIAGNOSIS — M545 Low back pain, unspecified: Secondary | ICD-10-CM | POA: Diagnosis not present

## 2021-06-02 DIAGNOSIS — M2569 Stiffness of other specified joint, not elsewhere classified: Secondary | ICD-10-CM | POA: Diagnosis not present

## 2021-06-02 DIAGNOSIS — R293 Abnormal posture: Secondary | ICD-10-CM | POA: Diagnosis not present

## 2021-06-02 DIAGNOSIS — M6281 Muscle weakness (generalized): Secondary | ICD-10-CM | POA: Diagnosis not present

## 2021-06-28 DIAGNOSIS — H5203 Hypermetropia, bilateral: Secondary | ICD-10-CM | POA: Diagnosis not present
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
## 2021-07-31 DIAGNOSIS — Z23 Encounter for immunization: Secondary | ICD-10-CM | POA: Diagnosis not present

## 2021-10-04 DIAGNOSIS — H16123 Filamentary keratitis, bilateral: Secondary | ICD-10-CM | POA: Diagnosis not present

## 2021-10-04 DIAGNOSIS — H35371 Puckering of macula, right eye: Secondary | ICD-10-CM | POA: Diagnosis not present

## 2021-10-04 DIAGNOSIS — Z961 Presence of intraocular lens: Secondary | ICD-10-CM | POA: Diagnosis not present

## 2021-10-04 DIAGNOSIS — H04123 Dry eye syndrome of bilateral lacrimal glands: Secondary | ICD-10-CM | POA: Diagnosis not present

## 2021-11-01 DIAGNOSIS — Z13220 Encounter for screening for lipoid disorders: Secondary | ICD-10-CM | POA: Diagnosis not present

## 2021-11-01 DIAGNOSIS — Z136 Encounter for screening for cardiovascular disorders: Secondary | ICD-10-CM | POA: Diagnosis not present

## 2021-11-01 DIAGNOSIS — M810 Age-related osteoporosis without current pathological fracture: Secondary | ICD-10-CM | POA: Diagnosis not present

## 2021-11-01 DIAGNOSIS — Z79899 Other long term (current) drug therapy: Secondary | ICD-10-CM | POA: Diagnosis not present

## 2021-11-01 DIAGNOSIS — E559 Vitamin D deficiency, unspecified: Secondary | ICD-10-CM | POA: Diagnosis not present

## 2021-11-01 DIAGNOSIS — Z1211 Encounter for screening for malignant neoplasm of colon: Secondary | ICD-10-CM | POA: Diagnosis not present

## 2021-11-01 DIAGNOSIS — Z0000 Encounter for general adult medical examination without abnormal findings: Secondary | ICD-10-CM | POA: Diagnosis not present

## 2021-11-02 DIAGNOSIS — Z0100 Encounter for examination of eyes and vision without abnormal findings: Secondary | ICD-10-CM | POA: Diagnosis not present

## 2021-11-30 DIAGNOSIS — Z1211 Encounter for screening for malignant neoplasm of colon: Secondary | ICD-10-CM | POA: Diagnosis not present

## 2022-02-20 DIAGNOSIS — B029 Zoster without complications: Secondary | ICD-10-CM | POA: Diagnosis not present

## 2022-02-26 DIAGNOSIS — M810 Age-related osteoporosis without current pathological fracture: Secondary | ICD-10-CM | POA: Diagnosis not present

## 2022-02-26 DIAGNOSIS — I10 Essential (primary) hypertension: Secondary | ICD-10-CM | POA: Diagnosis not present

## 2022-05-28 DIAGNOSIS — H02889 Meibomian gland dysfunction of unspecified eye, unspecified eyelid: Secondary | ICD-10-CM | POA: Diagnosis not present

## 2022-05-28 DIAGNOSIS — H04123 Dry eye syndrome of bilateral lacrimal glands: Secondary | ICD-10-CM | POA: Diagnosis not present

## 2022-06-12 DIAGNOSIS — N6314 Unspecified lump in the right breast, lower inner quadrant: Secondary | ICD-10-CM | POA: Diagnosis not present

## 2022-06-12 DIAGNOSIS — L989 Disorder of the skin and subcutaneous tissue, unspecified: Secondary | ICD-10-CM | POA: Diagnosis not present

## 2022-06-12 DIAGNOSIS — Z1231 Encounter for screening mammogram for malignant neoplasm of breast: Secondary | ICD-10-CM | POA: Diagnosis not present

## 2022-06-14 ENCOUNTER — Other Ambulatory Visit: Payer: Self-pay | Admitting: Family Medicine

## 2022-06-14 DIAGNOSIS — N6314 Unspecified lump in the right breast, lower inner quadrant: Secondary | ICD-10-CM

## 2022-06-19 DIAGNOSIS — H524 Presbyopia: Secondary | ICD-10-CM | POA: Diagnosis not present

## 2022-06-20 DIAGNOSIS — H0288B Meibomian gland dysfunction left eye, upper and lower eyelids: Secondary | ICD-10-CM | POA: Diagnosis not present

## 2022-06-20 DIAGNOSIS — H35371 Puckering of macula, right eye: Secondary | ICD-10-CM | POA: Diagnosis not present

## 2022-06-20 DIAGNOSIS — H26492 Other secondary cataract, left eye: Secondary | ICD-10-CM | POA: Diagnosis not present

## 2022-06-20 DIAGNOSIS — H04123 Dry eye syndrome of bilateral lacrimal glands: Secondary | ICD-10-CM | POA: Diagnosis not present

## 2022-06-27 ENCOUNTER — Ambulatory Visit
Admission: RE | Admit: 2022-06-27 | Discharge: 2022-06-27 | Disposition: A | Discharge status: Home / Self Care | Payer: Medicare HMO | Source: Ambulatory Visit | Attending: Family Medicine | Admitting: Family Medicine

## 2022-06-27 DIAGNOSIS — R928 Other abnormal and inconclusive findings on diagnostic imaging of breast: Secondary | ICD-10-CM | POA: Diagnosis not present

## 2022-06-27 DIAGNOSIS — N6314 Unspecified lump in the right breast, lower inner quadrant: Secondary | ICD-10-CM

## 2022-07-25 DIAGNOSIS — H25011 Cortical age-related cataract, right eye: Secondary | ICD-10-CM | POA: Diagnosis not present

## 2022-07-25 DIAGNOSIS — H26491 Other secondary cataract, right eye: Secondary | ICD-10-CM | POA: Diagnosis not present

## 2022-09-11 DIAGNOSIS — R079 Chest pain, unspecified: Secondary | ICD-10-CM | POA: Diagnosis not present

## 2022-11-08 DIAGNOSIS — M3500 Sicca syndrome, unspecified: Secondary | ICD-10-CM | POA: Diagnosis not present

## 2022-11-08 DIAGNOSIS — E559 Vitamin D deficiency, unspecified: Secondary | ICD-10-CM | POA: Diagnosis not present

## 2022-11-08 DIAGNOSIS — M810 Age-related osteoporosis without current pathological fracture: Secondary | ICD-10-CM | POA: Diagnosis not present

## 2022-11-08 DIAGNOSIS — Z5181 Encounter for therapeutic drug level monitoring: Secondary | ICD-10-CM | POA: Diagnosis not present

## 2022-11-08 DIAGNOSIS — Z136 Encounter for screening for cardiovascular disorders: Secondary | ICD-10-CM | POA: Diagnosis not present

## 2022-11-08 DIAGNOSIS — E2839 Other primary ovarian failure: Secondary | ICD-10-CM | POA: Diagnosis not present

## 2022-11-08 DIAGNOSIS — Z13220 Encounter for screening for lipoid disorders: Secondary | ICD-10-CM | POA: Diagnosis not present

## 2022-11-08 DIAGNOSIS — Z1211 Encounter for screening for malignant neoplasm of colon: Secondary | ICD-10-CM | POA: Diagnosis not present

## 2022-11-08 DIAGNOSIS — I10 Essential (primary) hypertension: Secondary | ICD-10-CM | POA: Diagnosis not present

## 2022-11-08 DIAGNOSIS — Z0000 Encounter for general adult medical examination without abnormal findings: Secondary | ICD-10-CM | POA: Diagnosis not present

## 2022-12-12 DIAGNOSIS — Z1211 Encounter for screening for malignant neoplasm of colon: Secondary | ICD-10-CM | POA: Diagnosis not present

## 2023-01-28 DIAGNOSIS — L718 Other rosacea: Secondary | ICD-10-CM | POA: Diagnosis not present

## 2023-01-28 DIAGNOSIS — L820 Inflamed seborrheic keratosis: Secondary | ICD-10-CM | POA: Diagnosis not present

## 2023-03-20 DIAGNOSIS — M810 Age-related osteoporosis without current pathological fracture: Secondary | ICD-10-CM | POA: Diagnosis not present

## 2023-03-20 DIAGNOSIS — M3500 Sicca syndrome, unspecified: Secondary | ICD-10-CM | POA: Diagnosis not present

## 2023-03-20 DIAGNOSIS — I10 Essential (primary) hypertension: Secondary | ICD-10-CM | POA: Diagnosis not present

## 2023-03-20 DIAGNOSIS — E559 Vitamin D deficiency, unspecified: Secondary | ICD-10-CM | POA: Diagnosis not present

## 2023-03-25 ENCOUNTER — Other Ambulatory Visit (HOSPITAL_BASED_OUTPATIENT_CLINIC_OR_DEPARTMENT_OTHER): Payer: Self-pay | Admitting: Internal Medicine

## 2023-03-25 DIAGNOSIS — M810 Age-related osteoporosis without current pathological fracture: Secondary | ICD-10-CM

## 2023-05-06 ENCOUNTER — Other Ambulatory Visit (HOSPITAL_BASED_OUTPATIENT_CLINIC_OR_DEPARTMENT_OTHER): Payer: Medicare HMO

## 2023-05-10 DIAGNOSIS — U071 COVID-19: Secondary | ICD-10-CM | POA: Diagnosis not present

## 2023-05-13 ENCOUNTER — Ambulatory Visit (HOSPITAL_BASED_OUTPATIENT_CLINIC_OR_DEPARTMENT_OTHER): Payer: Medicare HMO

## 2023-05-20 ENCOUNTER — Ambulatory Visit (HOSPITAL_BASED_OUTPATIENT_CLINIC_OR_DEPARTMENT_OTHER)
Admission: RE | Admit: 2023-05-20 | Discharge: 2023-05-20 | Disposition: A | Discharge status: Home / Self Care | Payer: Medicare HMO | Source: Ambulatory Visit | Attending: Internal Medicine | Admitting: Internal Medicine

## 2023-05-20 DIAGNOSIS — M810 Age-related osteoporosis without current pathological fracture: Secondary | ICD-10-CM | POA: Diagnosis not present

## 2023-06-12 DIAGNOSIS — E559 Vitamin D deficiency, unspecified: Secondary | ICD-10-CM | POA: Diagnosis not present

## 2023-06-12 DIAGNOSIS — M810 Age-related osteoporosis without current pathological fracture: Secondary | ICD-10-CM | POA: Diagnosis not present

## 2023-08-05 DIAGNOSIS — Z0100 Encounter for examination of eyes and vision without abnormal findings: Secondary | ICD-10-CM | POA: Diagnosis not present

## 2023-09-25 DIAGNOSIS — L603 Nail dystrophy: Secondary | ICD-10-CM | POA: Diagnosis not present

## 2023-11-14 DIAGNOSIS — M3500 Sicca syndrome, unspecified: Secondary | ICD-10-CM | POA: Diagnosis not present

## 2023-11-14 DIAGNOSIS — Z0000 Encounter for general adult medical examination without abnormal findings: Secondary | ICD-10-CM | POA: Diagnosis not present

## 2023-11-14 DIAGNOSIS — E559 Vitamin D deficiency, unspecified: Secondary | ICD-10-CM | POA: Diagnosis not present

## 2023-11-14 DIAGNOSIS — R799 Abnormal finding of blood chemistry, unspecified: Secondary | ICD-10-CM | POA: Diagnosis not present

## 2023-11-14 DIAGNOSIS — Z13220 Encounter for screening for lipoid disorders: Secondary | ICD-10-CM | POA: Diagnosis not present

## 2023-11-14 DIAGNOSIS — M810 Age-related osteoporosis without current pathological fracture: Secondary | ICD-10-CM | POA: Diagnosis not present

## 2023-11-14 DIAGNOSIS — Z1211 Encounter for screening for malignant neoplasm of colon: Secondary | ICD-10-CM | POA: Diagnosis not present

## 2023-11-14 DIAGNOSIS — E871 Hypo-osmolality and hyponatremia: Secondary | ICD-10-CM | POA: Diagnosis not present

## 2023-11-14 DIAGNOSIS — Z8719 Personal history of other diseases of the digestive system: Secondary | ICD-10-CM | POA: Diagnosis not present

## 2023-11-14 DIAGNOSIS — Z1331 Encounter for screening for depression: Secondary | ICD-10-CM | POA: Diagnosis not present

## 2023-11-14 DIAGNOSIS — Z23 Encounter for immunization: Secondary | ICD-10-CM | POA: Diagnosis not present

## 2023-12-26 DIAGNOSIS — R799 Abnormal finding of blood chemistry, unspecified: Secondary | ICD-10-CM | POA: Diagnosis not present

## 2023-12-30 DIAGNOSIS — Z1211 Encounter for screening for malignant neoplasm of colon: Secondary | ICD-10-CM | POA: Diagnosis not present

## 2024-02-10 ENCOUNTER — Inpatient Hospital Stay

## 2024-02-10 ENCOUNTER — Encounter: Payer: Self-pay | Admitting: Hematology and Oncology

## 2024-02-10 ENCOUNTER — Inpatient Hospital Stay: Attending: Hematology and Oncology | Admitting: Hematology and Oncology

## 2024-02-10 VITALS — BP 171/96 | HR 88 | Temp 97.9°F | Resp 19 | Ht 65.5 in | Wt 120.2 lb

## 2024-02-10 DIAGNOSIS — M810 Age-related osteoporosis without current pathological fracture: Secondary | ICD-10-CM | POA: Diagnosis not present

## 2024-02-10 DIAGNOSIS — D72819 Decreased white blood cell count, unspecified: Secondary | ICD-10-CM

## 2024-02-10 DIAGNOSIS — M3500 Sicca syndrome, unspecified: Secondary | ICD-10-CM | POA: Insufficient documentation

## 2024-02-10 DIAGNOSIS — Z79899 Other long term (current) drug therapy: Secondary | ICD-10-CM | POA: Insufficient documentation

## 2024-02-10 DIAGNOSIS — K449 Diaphragmatic hernia without obstruction or gangrene: Secondary | ICD-10-CM | POA: Diagnosis not present

## 2024-02-10 LAB — CBC WITH DIFFERENTIAL (CANCER CENTER ONLY)
Abs Immature Granulocytes: 0 10*3/uL (ref 0.00–0.07)
Basophils Absolute: 0 10*3/uL (ref 0.0–0.1)
Basophils Relative: 1 %
Eosinophils Absolute: 0 10*3/uL (ref 0.0–0.5)
Eosinophils Relative: 2 %
HCT: 42.8 % (ref 36.0–46.0)
Hemoglobin: 15 g/dL (ref 12.0–15.0)
Immature Granulocytes: 0 %
Lymphocytes Relative: 30 %
Lymphs Abs: 0.8 10*3/uL (ref 0.7–4.0)
MCH: 31 pg (ref 26.0–34.0)
MCHC: 35 g/dL (ref 30.0–36.0)
MCV: 88.4 fL (ref 80.0–100.0)
Monocytes Absolute: 0.3 10*3/uL (ref 0.1–1.0)
Monocytes Relative: 10 %
Neutro Abs: 1.6 10*3/uL — ABNORMAL LOW (ref 1.7–7.7)
Neutrophils Relative %: 57 %
Platelet Count: 263 10*3/uL (ref 150–400)
RBC: 4.84 MIL/uL (ref 3.87–5.11)
RDW: 12.8 % (ref 11.5–15.5)
WBC Count: 2.7 10*3/uL — ABNORMAL LOW (ref 4.0–10.5)
nRBC: 0 % (ref 0.0–0.2)

## 2024-02-10 LAB — VITAMIN B12: Vitamin B-12: 201 pg/mL (ref 180–914)

## 2024-02-10 LAB — SEDIMENTATION RATE: Sed Rate: 15 mm/h (ref 0–22)

## 2024-02-10 NOTE — Assessment & Plan Note (Signed)
 We do not have historical records dated prior to her diagnosis of Sjogren's She either have congenital leukopenia or leukopenia related to Sjogren's She has not been symptomatic I will order additional labs to rule out nutritional deficiency and we will call her with test results She does not need long-term follow-up

## 2024-02-10 NOTE — Progress Notes (Signed)
  [facility]cer Center CONSULT NOTE  Patient Care Team: Bufford Carne, FNP as PCP - General (Family Medicine)   ASSESSMENT & PLAN  Leukopenia We do not have historical records dated prior to her diagnosis of Sjogren's She either have congenital leukopenia or leukopenia related to Sjogren's She has not been symptomatic I will order additional labs to rule out nutritional deficiency and we will call her with test results She does not need long-term follow-up  SJOGREN'S SYNDROME This is chronic and stable She is seeing ophthalmologist for eyedrops for dry eyes and dentist for dry mouth She is comfortable not to follow with rheumatologist  Orders Placed This Encounter  Procedures   CBC with Differential (Cancer Center Only)    Standing Status:   Future    Number of Occurrences:   1    Expiration Date:   02/09/2025   Vitamin B12    Standing Status:   Future    Number of Occurrences:   1    Expiration Date:   02/09/2025   Methylmalonic acid, serum    Standing Status:   Future    Number of Occurrences:   1    Expiration Date:   02/09/2025   Sedimentation rate    Standing Status:   Future    Number of Occurrences:   1    Expiration Date:   02/09/2025    All questions were answered. The patient knows to call the clinic with any problems, questions or concerns. I spent 55 minutes counseling the patient face to face, counseling and review of plan of care.   Almeda Jacobs, MD 02/10/2024 12:50 PM   CHIEF COMPLAINTS/PURPOSE OF CONSULTATION:  Chronic leukopenia  HISTORY OF PRESENTING ILLNESS:  Erika Jensen 78 y.o. female is here because of chronic leukopenia.  She was found to have abnormal CBC from recent blood work from her primary care doctor I have opportunity to review his CBC dated back to 2008 Between 2008 to present, her white blood cell count fluctuated between 2.6-3.9 She has been diagnosed with Sjogren's syndrome for approximately 25 years with symptoms of  dry mouth and dry eyes She denies recent infection. The last prescription antibiotics was more than 3 months ago There is not reported symptoms of sinus congestion, cough, urinary frequency/urgency or dysuria, diarrhea, joint swelling/pain She has occasional intermittent skin rash She had no prior history or diagnosis of cancer. Her age appropriate screening programs are up-to-date. She used to drink 1 alcoholic drink per day but in the last 5 years, she has replace it with nonalcoholic wine  MEDICAL HISTORY:  Past Medical History:  Diagnosis Date   Esophageal stricture    Hernia    b/l   Osteoporosis    Sjoegren syndrome     SURGICAL HISTORY: Past Surgical History:  Procedure Laterality Date   HERNIA REPAIR     b/l    INTRAUTERINE DEVICE INSERTION      SOCIAL HISTORY: Social History   Socioeconomic History   Marital status: Married    Spouse name: Not on file   Number of children: 2   Years of education: Not on file   Highest education level: Not on file  Occupational History   Occupation: Former IT trainer, retired  Tobacco Use   Smoking status: Never   Smokeless tobacco: Never  Substance and Sexual Activity   Alcohol use: Yes    Comment: ocasasionally   Drug use: No   Sexual activity: Not on file  Other  Topics Concern   Not on file  Social History Narrative   Not on file   Social Drivers of Health   Financial Resource Strain: Not on file  Food Insecurity: Not on file  Transportation Needs: Not on file  Physical Activity: Not on file  Stress: Not on file  Social Connections: Not on file  Intimate Partner Violence: Not on file    FAMILY HISTORY: Family History  Problem Relation Age of Onset   Dementia Mother    Hypertension Mother    Seizures Father    Cancer Father        renal cell   Alcohol abuse Other    Anxiety disorder Other    Hypertension Other     ALLERGIES:  has no known allergies.  MEDICATIONS:  Current Outpatient Medications   Medication Sig Dispense Refill   Calcium Carb-Cholecalciferol (CALCIUM PLUS VITAMIN D PO) Take by mouth.     PRESCRIPTION MEDICATION Take 5 mLs by mouth at bedtime. Magic mouth wash with lidocaine 5 ml at night     pantoprazole  (PROTONIX ) 20 MG tablet Take 1 tablet (20 mg total) by mouth daily. 90 tablet 4   RESTASIS 0.05 % ophthalmic emulsion INSTILL 1 DROP INTO BOTH EYES TWICE A DAY  5   No current facility-administered medications for this visit.    REVIEW OF SYSTEMS:   Constitutional: Denies fevers, chills or abnormal night sweats Eyes: Denies blurriness of vision, double vision or watery eyes Ears, nose, mouth, throat, and face: Denies mucositis or sore throat Respiratory: Denies cough, dyspnea or wheezes Cardiovascular: Denies palpitation, chest discomfort or lower extremity swelling Gastrointestinal:  Denies nausea, heartburn or change in bowel habits Skin: Denies abnormal skin rashes Lymphatics: Denies new lymphadenopathy or easy bruising Neurological:Denies numbness, tingling or new weaknesses Behavioral/Psych: Mood is stable, no new changes  All other systems were reviewed with the patient and are negative.  PHYSICAL EXAMINATION: ECOG PERFORMANCE STATUS: 0 - Asymptomatic  Vitals:   02/10/24 1118  BP: (!) 171/96  Pulse: 88  Resp: 19  Temp: 97.9 F (36.6 C)  SpO2: 93%   Filed Weights   02/10/24 1118  Weight: 120 lb 3.2 oz (54.5 kg)    GENERAL:alert, no distress and comfortable SKIN: skin color, texture, turgor are normal, no rashes or significant lesions EYES: normal, conjunctiva are pink and non-injected, sclera clear OROPHARYNX:no exudate, no erythema and lips, buccal mucosa, and tongue normal  NECK: supple, thyroid  normal size, non-tender, without nodularity LYMPH:  no palpable lymphadenopathy in the cervical, axillary or inguinal LUNGS: clear to auscultation and percussion with normal breathing effort HEART: regular rate & rhythm and no murmurs and no lower  extremity edema ABDOMEN:abdomen soft, non-tender and normal bowel sounds Musculoskeletal:no cyanosis of digits and no clubbing  PSYCH: alert & oriented x 3 with fluent speech NEURO: no focal motor/sensory deficits  LABORATORY DATA:  I have reviewed the data as listed Recent Results (from the past 2160 hours)  CBC with Differential (Cancer Center Only)     Status: Abnormal   Collection Time: 02/10/24 11:51 AM  Result Value Ref Range   WBC Count 2.7 (L) 4.0 - 10.5 K/uL   RBC 4.84 3.87 - 5.11 MIL/uL   Hemoglobin 15.0 12.0 - 15.0 g/dL   HCT 16.1 09.6 - 04.5 %   MCV 88.4 80.0 - 100.0 fL   MCH 31.0 26.0 - 34.0 pg   MCHC 35.0 30.0 - 36.0 g/dL   RDW 40.9 81.1 - 91.4 %  Platelet Count 263 150 - 400 K/uL   nRBC 0.0 0.0 - 0.2 %   Neutrophils Relative % 57 %   Neutro Abs 1.6 (L) 1.7 - 7.7 K/uL   Lymphocytes Relative 30 %   Lymphs Abs 0.8 0.7 - 4.0 K/uL   Monocytes Relative 10 %   Monocytes Absolute 0.3 0.1 - 1.0 K/uL   Eosinophils Relative 2 %   Eosinophils Absolute 0.0 0.0 - 0.5 K/uL   Basophils Relative 1 %   Basophils Absolute 0.0 0.0 - 0.1 K/uL   Immature Granulocytes 0 %   Abs Immature Granulocytes 0.00 0.00 - 0.07 K/uL    Comment: Performed at [facility], [address] [place], [place] [address]

## 2024-02-10 NOTE — Assessment & Plan Note (Signed)
 This is chronic and stable She is seeing ophthalmologist for eyedrops for dry eyes and dentist for dry mouth She is comfortable not to follow with rheumatologist

## 2024-02-12 LAB — METHYLMALONIC ACID, SERUM: Methylmalonic Acid, Quantitative: 117 nmol/L (ref 0–378)

## 2024-02-13 ENCOUNTER — Telehealth: Payer: Self-pay | Admitting: Hematology and Oncology

## 2024-02-13 NOTE — Telephone Encounter (Signed)
 I reviewed test results with the patient Her white blood cell count is low She has borderline low vitamin B12 level and I recommend oral vitamin B12 supplement at 1000 mcg daily I recommend she has her B12 level rechecked with her primary care doctor within next visit and if her B12 level is still low, she can benefit from vitamin B12 injections I addressed all her questions

## 2024-05-14 ENCOUNTER — Other Ambulatory Visit: Payer: Self-pay | Admitting: Medical Genetics

## 2024-05-19 DIAGNOSIS — N9089 Other specified noninflammatory disorders of vulva and perineum: Secondary | ICD-10-CM | POA: Diagnosis not present

## 2024-05-19 DIAGNOSIS — R030 Elevated blood-pressure reading, without diagnosis of hypertension: Secondary | ICD-10-CM | POA: Diagnosis not present

## 2024-05-26 ENCOUNTER — Other Ambulatory Visit (HOSPITAL_COMMUNITY)

## 2024-06-01 ENCOUNTER — Other Ambulatory Visit (HOSPITAL_COMMUNITY)

## 2024-07-02 DIAGNOSIS — Z961 Presence of intraocular lens: Secondary | ICD-10-CM | POA: Diagnosis not present

## 2024-07-02 DIAGNOSIS — H524 Presbyopia: Secondary | ICD-10-CM | POA: Diagnosis not present

## 2024-07-28 ENCOUNTER — Telehealth: Payer: Self-pay | Admitting: Medical Genetics

## 2024-07-28 NOTE — Telephone Encounter (Signed)
 07/28/24 1:43 PM  Confirmed participant was Erika Jensen [id number] . Participant requested to be withdrawn from the study. Participant did not provide a reason for withdrawing.
